# Patient Record
Sex: Female | Born: 1960 | Race: White | Hispanic: No | Marital: Married | Smoking: Never smoker
Health system: Southern US, Community
[De-identification: ages and names within clinical notes are randomized; demographics above are authoritative.]

## PROBLEM LIST (undated history)

## (undated) DIAGNOSIS — N189 Chronic kidney disease, unspecified: Secondary | ICD-10-CM

## (undated) DIAGNOSIS — E039 Hypothyroidism, unspecified: Secondary | ICD-10-CM

## (undated) DIAGNOSIS — C801 Malignant (primary) neoplasm, unspecified: Secondary | ICD-10-CM

## (undated) DIAGNOSIS — K589 Irritable bowel syndrome without diarrhea: Secondary | ICD-10-CM

## (undated) DIAGNOSIS — I341 Nonrheumatic mitral (valve) prolapse: Secondary | ICD-10-CM

## (undated) HISTORY — PX: TUBAL LIGATION: SHX77

## (undated) HISTORY — PX: SHOULDER SURGERY: SHX246

## (undated) HISTORY — PX: MANDIBLE SURGERY: SHX707

## (undated) HISTORY — PX: APPENDECTOMY: SHX54

## (undated) HISTORY — PX: ABDOMINAL HYSTERECTOMY: SHX81

## (undated) HISTORY — PX: BREAST SURGERY: SHX581

---

## 1997-07-04 ENCOUNTER — Other Ambulatory Visit: Admission: RE | Admit: 1997-07-04 | Discharge: 1997-07-04 | Payer: Self-pay | Admitting: Obstetrics & Gynecology

## 1998-10-22 ENCOUNTER — Other Ambulatory Visit: Admission: RE | Admit: 1998-10-22 | Discharge: 1998-10-22 | Payer: Self-pay | Admitting: Obstetrics & Gynecology

## 1999-11-04 ENCOUNTER — Other Ambulatory Visit: Admission: RE | Admit: 1999-11-04 | Discharge: 1999-11-04 | Payer: Self-pay | Admitting: Obstetrics & Gynecology

## 2000-11-26 ENCOUNTER — Other Ambulatory Visit: Admission: RE | Admit: 2000-11-26 | Discharge: 2000-11-26 | Payer: Self-pay | Admitting: Obstetrics & Gynecology

## 2002-01-13 ENCOUNTER — Other Ambulatory Visit: Admission: RE | Admit: 2002-01-13 | Discharge: 2002-01-13 | Payer: Self-pay | Admitting: Obstetrics & Gynecology

## 2003-02-02 ENCOUNTER — Other Ambulatory Visit: Admission: RE | Admit: 2003-02-02 | Discharge: 2003-02-02 | Payer: Self-pay | Admitting: Obstetrics & Gynecology

## 2003-04-13 ENCOUNTER — Ambulatory Visit (HOSPITAL_COMMUNITY): Admission: RE | Admit: 2003-04-13 | Discharge: 2003-04-13 | Payer: Self-pay | Admitting: Obstetrics & Gynecology

## 2003-04-13 ENCOUNTER — Encounter (INDEPENDENT_AMBULATORY_CARE_PROVIDER_SITE_OTHER): Payer: Self-pay | Admitting: *Deleted

## 2004-02-05 ENCOUNTER — Other Ambulatory Visit: Admission: RE | Admit: 2004-02-05 | Discharge: 2004-02-05 | Payer: Self-pay

## 2004-09-01 ENCOUNTER — Ambulatory Visit: Payer: Self-pay | Admitting: Unknown Physician Specialty

## 2005-03-02 ENCOUNTER — Other Ambulatory Visit: Admission: RE | Admit: 2005-03-02 | Discharge: 2005-03-02 | Payer: Self-pay | Admitting: Obstetrics & Gynecology

## 2005-05-19 ENCOUNTER — Ambulatory Visit: Payer: Self-pay | Admitting: Obstetrics & Gynecology

## 2005-08-06 ENCOUNTER — Ambulatory Visit (HOSPITAL_COMMUNITY): Admission: RE | Admit: 2005-08-06 | Discharge: 2005-08-06 | Payer: Self-pay | Admitting: Obstetrics & Gynecology

## 2006-04-15 ENCOUNTER — Encounter: Admission: RE | Admit: 2006-04-15 | Discharge: 2006-04-15 | Payer: Self-pay | Admitting: Obstetrics & Gynecology

## 2006-06-30 ENCOUNTER — Ambulatory Visit (HOSPITAL_COMMUNITY): Admission: RE | Admit: 2006-06-30 | Discharge: 2006-07-01 | Payer: Self-pay | Admitting: Obstetrics & Gynecology

## 2006-06-30 ENCOUNTER — Encounter (INDEPENDENT_AMBULATORY_CARE_PROVIDER_SITE_OTHER): Payer: Self-pay | Admitting: Specialist

## 2006-11-24 ENCOUNTER — Ambulatory Visit: Payer: Self-pay | Admitting: Family Medicine

## 2006-12-07 ENCOUNTER — Ambulatory Visit: Payer: Self-pay | Admitting: Family Medicine

## 2007-10-12 ENCOUNTER — Ambulatory Visit: Payer: Self-pay | Admitting: Unknown Physician Specialty

## 2007-10-21 ENCOUNTER — Ambulatory Visit: Payer: Self-pay | Admitting: Unknown Physician Specialty

## 2007-10-26 ENCOUNTER — Ambulatory Visit: Payer: Self-pay | Admitting: Unknown Physician Specialty

## 2007-10-26 HISTORY — PX: UPPER GI ENDOSCOPY: SHX6162

## 2007-10-26 LAB — HM COLONOSCOPY

## 2008-01-18 ENCOUNTER — Encounter: Admission: RE | Admit: 2008-01-18 | Discharge: 2008-01-18 | Payer: Self-pay | Admitting: Family Medicine

## 2008-02-13 ENCOUNTER — Ambulatory Visit: Payer: Self-pay | Admitting: Family Medicine

## 2008-11-26 ENCOUNTER — Ambulatory Visit: Payer: Self-pay | Admitting: Family Medicine

## 2009-02-28 ENCOUNTER — Ambulatory Visit: Payer: Self-pay | Admitting: Specialist

## 2009-04-09 ENCOUNTER — Ambulatory Visit: Payer: Self-pay | Admitting: Specialist

## 2009-04-16 ENCOUNTER — Ambulatory Visit: Payer: Self-pay | Admitting: Specialist

## 2010-01-13 ENCOUNTER — Ambulatory Visit: Payer: Self-pay | Admitting: Family Medicine

## 2010-03-07 ENCOUNTER — Ambulatory Visit: Payer: Self-pay | Admitting: Specialist

## 2010-03-16 ENCOUNTER — Encounter: Payer: Self-pay | Admitting: Obstetrics & Gynecology

## 2010-05-07 ENCOUNTER — Ambulatory Visit: Payer: Self-pay | Admitting: Unknown Physician Specialty

## 2010-06-30 LAB — HM DEXA SCAN: HM Dexa Scan: NORMAL

## 2010-07-11 NOTE — Op Note (Signed)
NAMEDURU, REIGER                ACCOUNT NO.:  [id number]   MEDICAL RECORD NO.:  [id number]          PATIENT TYPE:  AMB   LOCATION:  SDC                           FACILITY:  [facility]   PHYSICIAN:  Freddy Finner, M.D.   DATE OF BIRTH:  04-05-1960   DATE OF PROCEDURE:  08/06/2005  DATE OF DISCHARGE:                                 OPERATIVE REPORT   PREOPERATIVE DIAGNOSIS:  Persistent pelvic pain.   POSTOPERATIVE DIAGNOSES:  1.  Persistent pelvic pain.  2.  Boggy irregular enlargement of uterus consistent with adenomyosis.  3.  One filmy adhesion of sigmoid epiploica at the left cornual uterus.  4.  Small pseudo-window in the cul-de-sac possibly consistent with      endometriosis.  5.  No other intra-abdominal abnormalities noted.   OPERATIVE PROCEDURE:  1.  Diagnostic laparoscopy.  2.  Lysis of adhesions.  3.  Uterosacral nerve ablation with bipolar fulguration.  4.  Fulguration of pseudo0window in the cul-de-sac.   SURGEON:  Freddy Finner, M.D.   ANESTHESIA:  General.   INTRAOPERATIVE COMPLICATIONS:  None.   ESTIMATED INTRAOPERATIVE BLOOD LOSS:  Less than 5 mL.   HISTORY OF PRESENT ILLNESS:  The patient is a 50 year old who has had  chronic intermittent recurring pelvic pain.  In 2005, she had hysteroscopy,  D&C and NovaSure endometrial ablation which has produced a very good  clinical result, but in the last several months, she has continued to have  pelvic pain.  Her only persisting ultrasound and clinical findings are boggy  enlargement of the uterus.  She has had a CT of the abdomen and pelvis, all  of which was normal.  She is admitted now for laparoscopy.   DESCRIPTION OF PROCEDURE:  She was admitted on the morning of surgery,  brought to the operating room after receiving a 1-g bolus of Rocephin.  She  was placed under general anesthesia and placed in the dorsal lithotomy  position using the Allen stirrups system.  Betadine prep was carried out in  the usual  fashion of the abdomen and vagina.  Bladder was evacuated with  sterile technique using a Robinson catheter.  Sterile drapes were applied  after application of a Hulka tenaculum to the cervix.  Two small incisions  were made, 1 at the umbilicus and 1 just above the symphysis.  Through the  upper incision, a non-bladed disposable trocar was used.  Direct inspection  revealed adequate placement and no evidence of injury on entry.  Pneumoperitoneum was allowed to accumulate with carbon dioxide gas.  A 5-mm  trocar was placed in the lower incision under direct visualization and  __________  prior to being used for traction and exposure during the  procedure.  Systematic examination of the pelvic and abdominal contents was  carried out; findings are noted above and are recorded in photographs, which  were taken, in the office record.  Using the bipolar Kleppinger forceps, the  uterosacral ligaments were ablated at their junction with the cervix on each  side.  A small pseudo-window in the cul-de-sac was cul-de-sac was  fulgurated  with this also.  The adhesion to the left fundus was lysed just by traction  with a probe and traction on the uterus.  At completion of the procedure,  hemostasis was adequate.  All instruments were removed.  Gas was allowed to  escape from the abdomen.  Plain Marcaine 0.25% was injected into incisions  sites for postoperative analgesia.  The incisions were closed with  interrupted subcuticular sutures of 3-0 Dexon and Steri-Strips were applied  to the lower incision.  The patient was awakened and taken to the recovery  room in good condition.  She was given Toradol 30 mg IV and 30 mg IM  immediately on completion of the procedure.      Freddy Finner, M.D.  Electronically Signed     WRN/MEDQ  D:  08/06/2005  T:  08/06/2005  Job:  [id number]

## 2010-07-11 NOTE — H&P (Signed)
Crystal Russo, Crystal Russo                ACCOUNT NO.:  [id number]   MEDICAL RECORD NO.:  [id number]          PATIENT TYPE:  AMB   LOCATION:  SDC                           FACILITY:  [facility]   PHYSICIAN:  Freddy Finner, M.D.   DATE OF BIRTH:  May 13, 1960   DATE OF ADMISSION:  06/30/2006  DATE OF DISCHARGE:                              HISTORY & PHYSICAL   STAT PREADMISSION HISTORY AND PHYSICAL   ADMITTING DIAGNOSES:  1. Chronic pelvic pain.  2. Uterine enlargement most consistent with adenomyosis.   Patient is a 50 year old, white married female, gravida 4, para 3, who  has a long history of pelvic pain and menorrhagia.  Her primary  complaint was of severe dysmenorrhea and menorrhagia and in February of  2005 she had an endometrial ablation and from August of 2005 to the  present time she has had minimal if any cyclic uterine bleeding.  In  January of 2007, she first presented complaining of cyclic pelvic pain  even in the absence of menses, and she was evaluated at that time with  ultrasound of the pelvis, which did show slight enlargement of the  uterus, but no hematocolpos was identified; she did have a follicular  ovarian cyst.  She was started on NuvaRing at that time.  She has not  had adequate response from this.  In addition, she has had laparoscopic  diagnosis with lysis of a pelvic adhesion and uterosacral nerve ablation  in June of 2007.  Her symptoms have persisted, and CT of the abdomen and  pelvis has been performed with negative findings.  She has also had a  urological consult with Dr. Isabel Caprice with no abnormal urological findings.  Additional past gynecologic surgery includes tubal sterilization in 1995  and at that time the uterus was a little bit enlarged, but no other  identifiable pelvic pathology was noted.  Based on the persistence of  her symptoms and the probability that the primary origin is of the  uterine, she has requested definitive surgery.  She is now admitted  for  laparoscopically-assisted vaginal hysterectomy and bilateral salpingo-  oophorectomy.   CURRENT REVIEW OF SYSTEMS:  Otherwise negative for any cardiopulmonary,  GI, or GU complaints.   PAST MEDICAL HISTORY:  The patient has previously been treated for a  problem with ulcers/regurg and for this she takes AcipHex on a regular  basis.  There are no other significant medical illnesses except for  hypothyroidism for which she takes Levoxyl.  She does have premenstrual  symptoms for which she takes Zoloft.  She has been tried on transdermal  estrogen replacement system to guarantee tolerance and she has tolerated  this.  She does not use cigarettes or alcohol.  She has never had a  blood transfusion.  She has no known drug allergies.  She has had three  previous vaginal births.  She also had a D&E for a missed abortion in  1998.   PHYSICAL EXAMINATION:  HEENT:  Grossly within normal limits, the thyroid  gland is not palpably enlarged.  HEART:  Normal sinus rhythm without murmurs,  rubs, or gallops audible to  my examination.  CHEST:  Clear to auscultation.  BREASTS:  Exam is normal, no palpable masses, no nipple discharge or  skin change.  ABDOMEN:  Soft and nontender without appreciable organomegaly or  palpable masses.  PELVIC EXAM:  The external genitalia, vagina, and cervix are normal to  inspection.  Bimanual reveals the uterus to be slightly enlarged, there  are no palpable adnexal masses, the rectum is palpably normal, and  rectovaginal exam confirms the above findings.  EXTREMITIES:  Without cyanosis, clubbing, or edema.   ASSESSMENT:  Persistent pelvic pain, suspected adenomyosis and/or  leiomyomas.  Options of therapy have been discussed with the patient,  she has concluded that she wants laparoscopically-assisted vaginal  hysterectomy and wants both tubes and ovaries removed at the same time.  The latter is secondary to recurrent ovarian cysts when not on oral   contraception.  A video was reviewed in the office describing the  procedure including the potential risk of injury to other organs,  infection, and hemorrhage.  The possibility for an open procedure has  been discussed in the event of complications.  The patient is now  admitted and prepared to proceed with surgery.      Freddy Finner, M.D.  Electronically Signed     WRN/MEDQ  D:  06/29/2006  T:  06/29/2006  Job:  [id number]

## 2010-07-11 NOTE — Discharge Summary (Signed)
Crystal Russo, Crystal Russo                ACCOUNT NO.:  [id number]   MEDICAL RECORD NO.:  [id number]          PATIENT TYPE:  OIB   LOCATION:  9313                          [facility]ACILITY:  [facility]   PHYSICIAN:  Freddy Finner, M.D.   DATE OF BIRTH:  01/02/1961   DATE OF ADMISSION:  06/30/2006  DATE OF DISCHARGE:  07/01/2006                               DISCHARGE SUMMARY   DISCHARGE DIAGNOSES:  1. Uterine adenomyosis.  2. Pelvic adhesions.  3. Chronic pelvic pain.  4. Dysfunctional bleeding.  5. Dyspareunia.   INTRAOPERATIVE AND POSTOPERATIVE COMPLICATIONS:  None.   SURGICAL PROCEDURES:  Laparoscopically-assisted vaginal hysterectomy,  bilateral salpingo-oophorectomy and lysis of pelvic adhesions.   DISPOSITION AT DISCHARGE:  The patient was in satisfactory condition on  the day after surgery.  She was tolerating a regular diet.  She was  having adequate bowel and bladder function.  She was discharged home.  To avoid vaginal entry, to call for heavy vaginal bleeding or severe  pain, to return to the office in 2 weeks for postoperative follow-up.  She was given Percocet 5/325 to be taken as needed 1-2 every 4 hours for  pain and Zofran oral dissolving tablets 4 mg to be taken every 6 hours  as needed for pain.  She has started Vivelle-Dot 0.1 to be used for  postoperative hormonal management therapy.   PRESENT ILLNESS/PAST HISTORY/FAMILY HISTORY/REVIEW OF SYSTEMS/PHYSICAL  EXAMINATION:  Details recorded in the admission note.  Briefly, the  patient continued to have chronic pain.  She had clinical and ultrasound  findings consistent with uterine adenomyosis.  Various conservative  management protocols were followed without success in managing her  symptoms.  She was admitted now for surgery.   LABORATORY DATA DURING THIS ADMISSION:  Preoperative CBC with hemoglobin  of 12.1.  Prothrombin time and PTT were normal.  Postoperative  hemoglobin was 11.1.   HOSPITAL COURSE:  The patient was  admitted on the morning of surgery.  She was treated perioperatively with PAS hose and with IV antibiotic.  The above-described surgical procedure was accomplished without  intraoperative difficulty.  Postoperatively, her course was  uncomplicated.  She remained afebrile.  Her hemoglobin was adequate.  She was ambulating without difficulty and having adequate bowel and  bladder function.  She was discharged home with disposition as noted  above.     Freddy Finner, M.D.  Electronically Signed    WRN/MEDQ  D:  08/09/2006  T:  08/09/2006  Job:  [id number]

## 2010-07-11 NOTE — Op Note (Signed)
Crystal Russo, Crystal Russo                ACCOUNT NO.:  [id number]   MEDICAL RECORD NO.:  [id number]          PATIENT TYPE:  OIB   LOCATION:  9313                          [facility]ACILITY:  [facility]   PHYSICIAN:  Freddy Finner, M.D.   DATE OF BIRTH:  [birth year]   DATE OF PROCEDURE:  06/30/2006  DATE OF DISCHARGE:                               OPERATIVE REPORT   PREOPERATIVE DIAGNOSIS:  Uterine enlargement, probable adenomyosis,  chronic pelvic pain.   POSTOPERATIVE DIAGNOSES:  1. Uterine enlargement, probable adenomyosis, chronic pelvic pain.  2. Adhesions of sigmoid epiploica to left uterine cornu and left      infundibulopelvic ligament.   OPERATIVE PROCEDURE:  Laparoscopic-assisted vaginal hysterectomy,  bilateral salpingo-oophorectomy, lysis of adhesions.   SURGEON:  Efraim Kaufmann, M.D.   ASSISTANT:  Zelphia Cairo, M.D.   ESTIMATED INTRAOPERATIVE BLOOD LOSS:  100 cc.   INTRAOPERATIVE COMPLICATIONS:  None.   PROCEDURE:  Details of the present illness are recorded in the admission  note.  The patient was admitted on the morning of surgery.  She was  brought to the operating room after receiving a 1 g bolus of Ancef IV.  She was placed under adequate general endotracheal anesthesia and placed  in the dorsal lithotomy position using the [employer] system.  Betadine prep using scrub followed by solution was carried, including  prep of the abdomen, perineum, and vagina.  Bladder was evacuated with a  sterile catheter.  Hulka tenaculum was attached to the cervix without  difficulty.  Sterile drapes were applied.  Two small incisions were made  through the old scars, one at the umbilicus, one just above the  symphysis.  Through the upper incision, an 11 mm bladed disposable  trocar was introduced while elevating the anterior abdominal wall  manually.  Direct inspection revealed no evidence of injury on entry and  adequate placement within the peritoneal cavity.  Pneumoperitoneum was  allowed to accumulate with carbon dioxide gas.  A 5 mm trocar was placed  through the lower incision just above the symphysis.  Through it,  various instruments were used, including a spring-loaded grasping  forceps and __________  irrigation system.  Scanning inspection of the  upper abdomen revealed no apparent abnormalities.  The appendix was not  visualized.  Pelvic contents were visualized and photographs made.  There were adhesions of the sigmoid colon to the cornual uterus and left  infundibulopelvic ligament.  No peritoneal lesions were noted.  Using  the tripolar device through the operating channel of the laparoscope,  the adhesions on the left were lysed.  The grasping forceps were used to  elevate the adnexa on the right side, and with progressive pedicles  using the tripolar device, the infundibulopelvic ligaments, round  ligament, and upper broad ligament was developed, sealed, and divided.  Left side was treated essentially identically.  Attention was then  turned vaginally.  Posterior weighted vaginal retractor was placed.  Hulka tenaculum was replaced with a Jacobs tenaculum.  Colpotomy  incision was made while tenting the mucosa posterior to the cervix.  The  cervix was circumscribed  with a scalpel.  Using the LigaSure device, the  uterosacral pedicles were sealed and divided.  The bladder pillars were  similarly taken, sealed, and divided.  The bladder was further advanced  off the cervix.  Cardinal ligament pedicles were taken, sealed, and  divided with the LigaSure.  Anterior peritoneum was entered.  Vessel  pedicles were taken, sealed, and divided with LigaSure, and second  pedicle above the vessels on the left side.  This allowed delivery of  the uterus through the introitus, with tubes and ovaries attached.  Angles of the vagina were then anchored to the uterosacrals with  mattress sutures of 0 Monocryl.  The uterosacrals were plicated and  posterior peritoneum  closed with interrupted 0 Monocryl.  Cuff was  closed vertically with figure-of-eights of 0 Monocryl.  Foley catheter  was placed.  Reinspection laparoscopically was carried out using the  [employer] irrigating system.  Hemostasis was complete.  Irrigating solution  was aspirated from the abdomen.  Gas was allowed to escape from the  abdomen.  Incisions were closed with interrupted subcuticular sutures of  3-0 Dexon.  0.25% plain Marcaine was injected into the incision sites  for postoperative analgesia.  Steri-Strips were applied to the lower  incision, and a sterile dressing to the umbilical incision.  The patient  was awakened and taken to recovery in good condition.      Freddy Finner, M.D.  Electronically Signed     WRN/MEDQ  D:  06/30/2006  T:  06/30/2006  Job:  [id number]

## 2010-07-11 NOTE — Op Note (Signed)
NAMECHEYENNE, Crystal Russo                          ACCOUNT NO.:  [id number]   MEDICAL RECORD NO.:  [id number]                   PATIENT TYPE:  AMB   LOCATION:  SDC                                  [facility]   PHYSICIAN:  Freddy Finner, M.D.                DATE OF BIRTH:  10/04/1960   DATE OF PROCEDURE:  04/13/2003  DATE OF DISCHARGE:                                 OPERATIVE REPORT   PREOPERATIVE DIAGNOSES:  1. Uterine enlargement, suspected adenomyosis.  2. Normal uterine cavity.  3. Clinical symptoms of severe menorrhagia unresponsive to oral     contraceptives.   POSTOPERATIVE DIAGNOSES:  1. Uterine enlargement, suspected adenomyosis.  2. Normal uterine cavity.  3. Clinical symptoms of severe menorrhagia unresponsive to oral     contraceptives.   OPERATIVE PROCEDURE:  1. Hysteroscopy.  2. D&C.  3. NovaSure endometrial ablation.   INTRAOPERATIVE COMPLICATIONS:  None.   ESTIMATED INTRAOPERATIVE BLOOD LOSS:  10 mL.   SORBITOL DEFICIT INTRAOPERATIVELY:  50 mL.   The patient is a 50 year old, who had sonohysterogram showing a normal  uterine cavity with thickening of the uterine wall, most consistent with  adenomyosis.  She has been unresponsive to hormonal manipulation to control  her very heavy menorrhagia, and thus first line of treatment has elected to  proceed with endometrial ablation.  She was admitted on the morning of  surgery.  She was given 2 g bolus of Cefotetan IV.  She was brought to the  operating room and there placed under adequate intravenous sedation, placed  in the dorsal lithotomy position using Allen stirrup system.  Betadine prep  was carried out in the usual fashion.  Bivalve speculum was introduced and  the cervix grasped with a single-tooth tenaculum on the anterior lip.  Paracervical block was placed using 10 mL of 1% plain Xylocaine.  The cervix  was sounded to 3.5 cm.  The uterus and cervix sounded to 9 cm.  This gave a  cavity length of 5.5  cm, cavity width which was later checked with a device  was 2.8.  The power setting required for this procedure was 85.  Time of  procedure was 105 seconds.  This was the NovaSure piece.  Prior to actually  performing NovaSure, the cervix was dilated to 23 with [facility] dilators.  The  ACMI 12.5-degree hysteroscope was introduced, and inspection revealed a  normal uterine cavity.  Gentle thorough curettage was carried out and a  sample submitted for histologic examination.  The NovaSure procedure was  then begun, and standard steps were followed with the representative in  attendance for consultation during his initial procedure.  The procedure was  accomplished without difficulty.  The NovaSure device removed from the  uterus.  Reinspection confirmed complete ablation of the endometrium.  Photographs were made of the endometrial cavity before and after NovaSure  therapy.  Please note  that  all safety measures were followed, including the carbon dioxide testing  for uterine perforation.  The patient was then awakened and taken to the  recovery room in good condition.  She was given Vicodin to be taken as  needed for postoperative pain.  She was given Toradol 30 mg IV and 30 mg IM  intraoperatively.                                               Freddy Finner, M.D.    WRN/MEDQ  D:  04/13/2003  T:  04/13/2003  Job:  [id number]

## 2010-12-01 LAB — TSH: TSH: 0.04 u[IU]/mL — AB (ref <=5.90)

## 2010-12-04 ENCOUNTER — Ambulatory Visit: Payer: Self-pay | Admitting: Endocrinology

## 2010-12-04 IMAGING — US US THYROID
1 series · 17 of 25 positions shown · non-contrast
Comparison: none

REASON FOR EXAM: multinodular goiter
COMMENTS:

[Series 1: us thyroid · 17 of 47 slices shown]
[im 1/47]
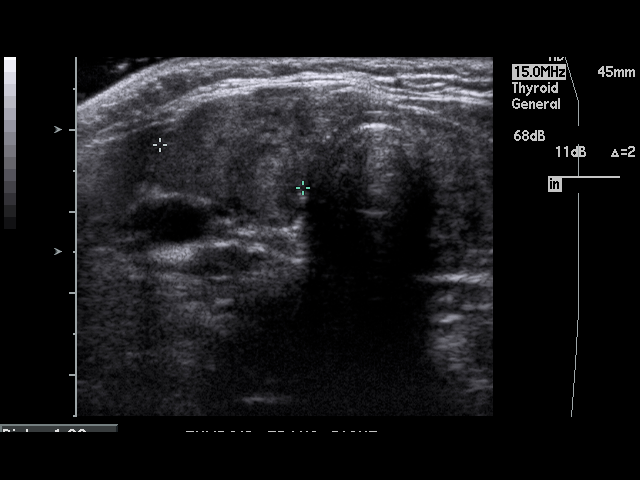
[im 4/47]
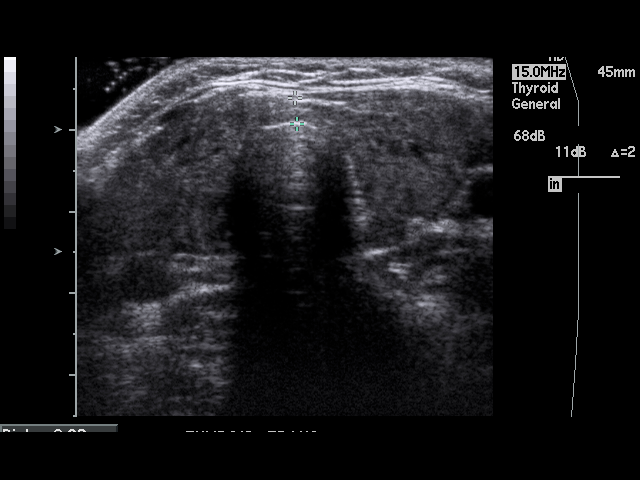
[im 6/47]
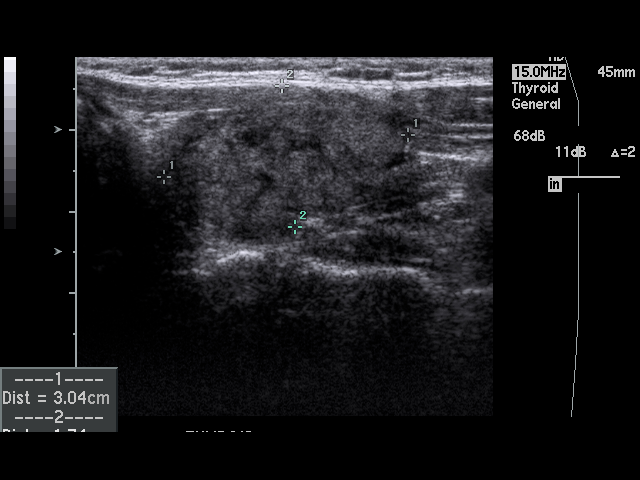
[im 10/47]
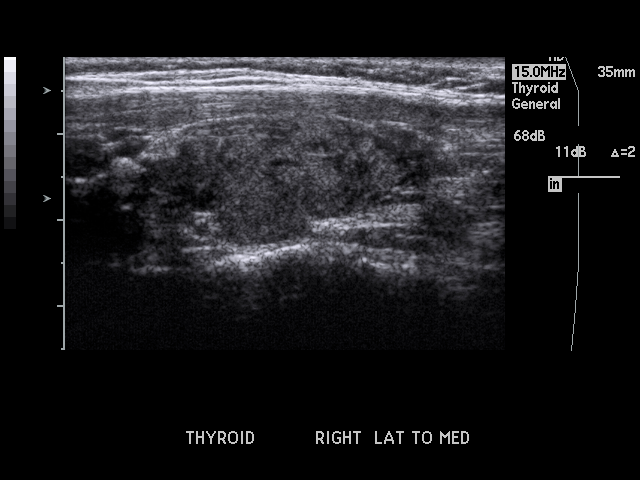
[im 12/47]
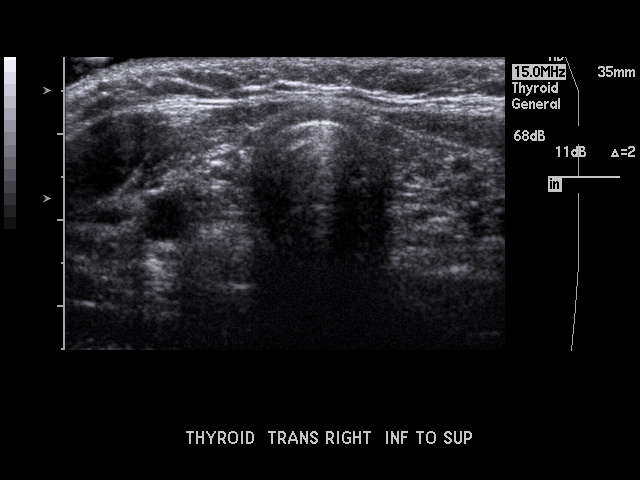
[im 16/47]
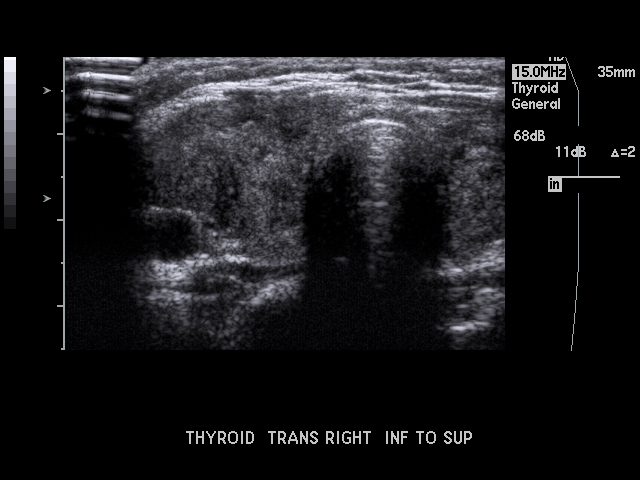
[im 18/47]
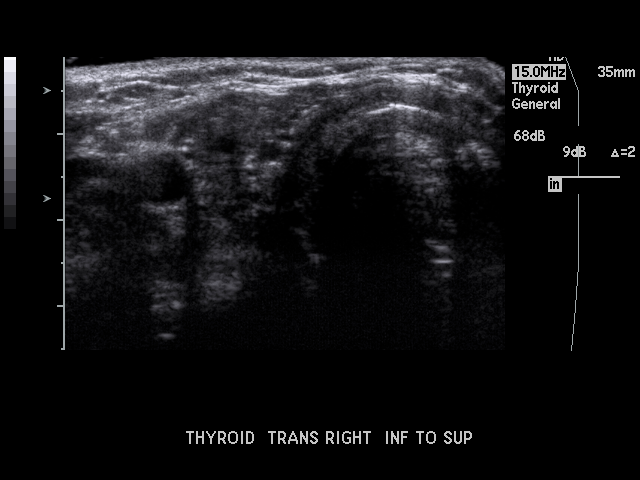
[im 22/47]
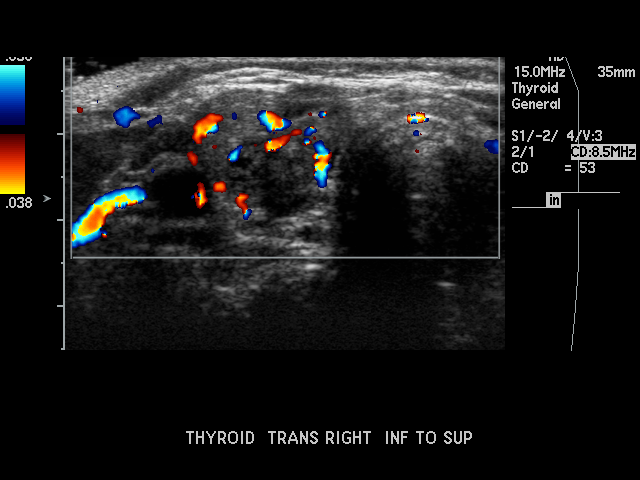
[im 24/47]
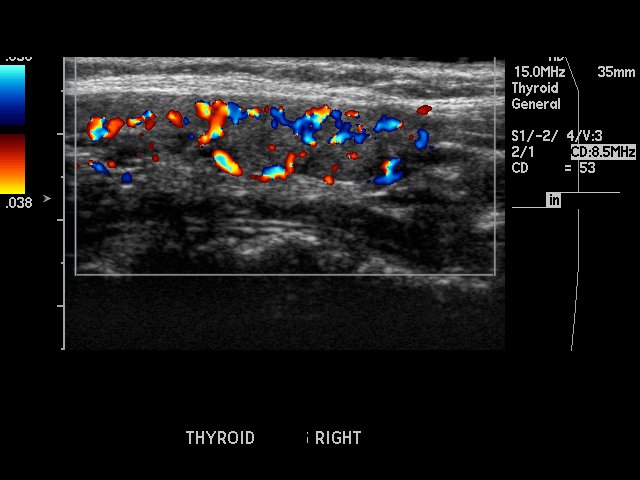
[im 25/47]
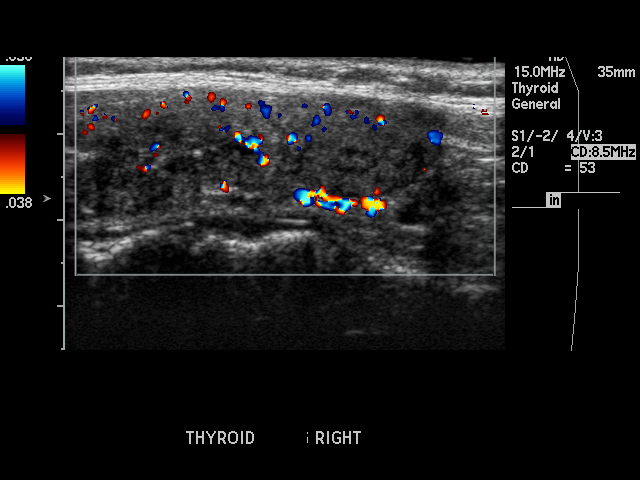
[im 29/47]
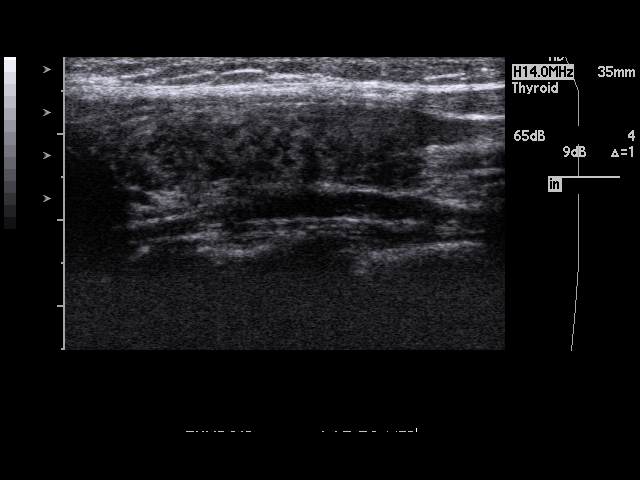
[im 31/47]
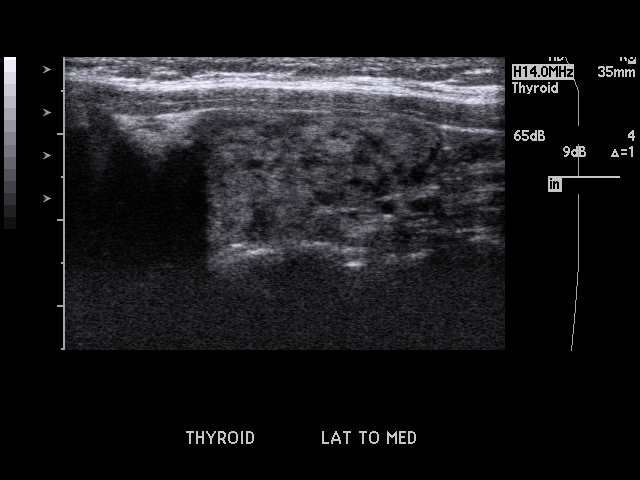
[im 35/47]
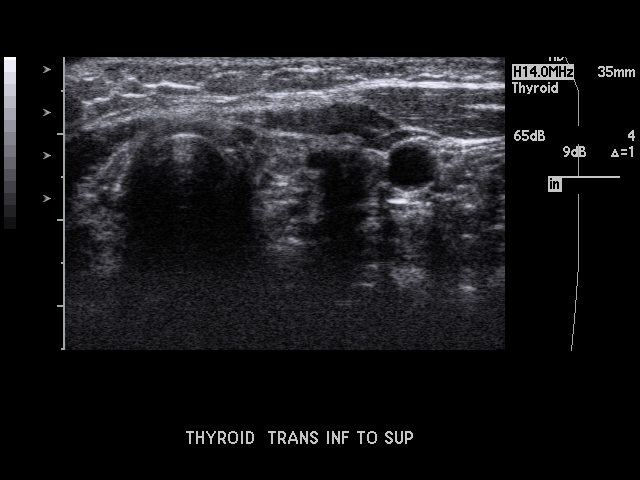
[im 37/47]
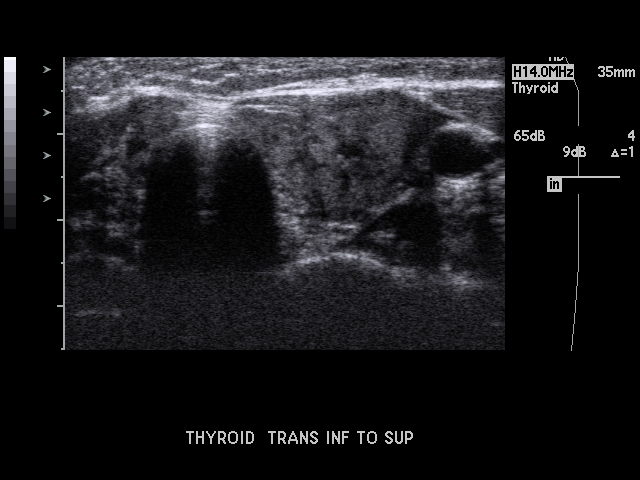
[im 41/47]
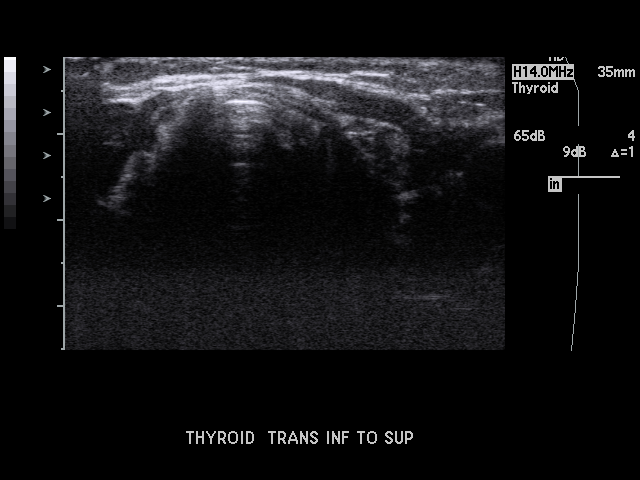
[im 43/47]
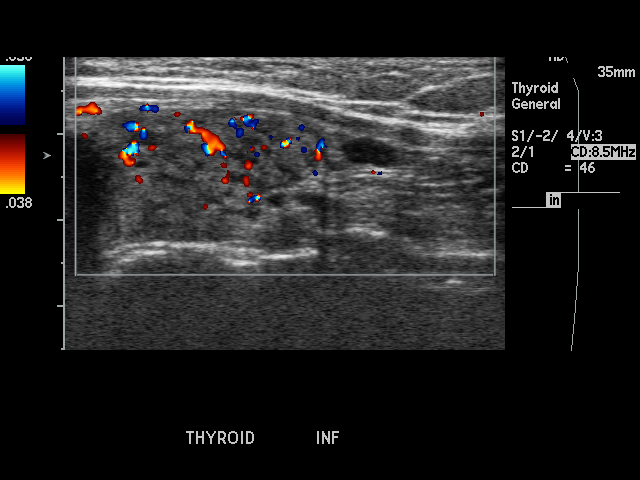
[im 47/47]
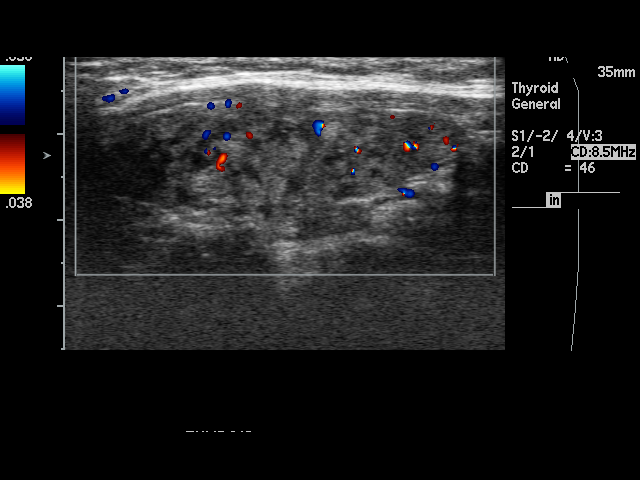

[17 of 25 positions shown; findings below may reference images not displayed]

PROCEDURE:     HADUNELI - HADUNELI THYROID  - [DATE] [DATE]

RESULT:     Thyroid gland sonogram shows the right lobe measures 3.15 x
x 1.83 cm. The left lobe measures 3.04 x 1.74 x 1.52 cm. The isthmus shows
an anterior to posterior dimension of 0.32 cm. There is a heterogeneous
appearance of both lobes with mixed echotexture. No definite calcifications
are evident. No well defined masses are present.
IMPRESSION: Very heterogeneous appearance of the gland diffusely. The
findings can be consistent with multinodular goiter.

## 2011-12-01 ENCOUNTER — Other Ambulatory Visit: Payer: Self-pay | Admitting: Endocrinology

## 2011-12-01 DIAGNOSIS — R1310 Dysphagia, unspecified: Secondary | ICD-10-CM

## 2011-12-03 ENCOUNTER — Ambulatory Visit
Admission: RE | Admit: 2011-12-03 | Discharge: 2011-12-03 | Disposition: A | Discharge status: Home / Self Care | Payer: BC Managed Care – PPO | Source: Ambulatory Visit | Attending: Endocrinology | Admitting: Endocrinology

## 2011-12-03 DIAGNOSIS — R1310 Dysphagia, unspecified: Secondary | ICD-10-CM

## 2012-05-30 ENCOUNTER — Ambulatory Visit: Payer: Self-pay | Admitting: Family Medicine

## 2012-10-13 DIAGNOSIS — Z86018 Personal history of other benign neoplasm: Secondary | ICD-10-CM

## 2012-10-13 HISTORY — DX: Personal history of other benign neoplasm: Z86.018

## 2013-09-25 ENCOUNTER — Other Ambulatory Visit: Payer: Self-pay | Admitting: Obstetrics & Gynecology

## 2013-09-26 LAB — CYTOLOGY - PAP

## 2014-03-21 LAB — CBC AND DIFFERENTIAL
HCT: 37 % (ref 36–46)
HEMOGLOBIN: 12.2 g/dL (ref 12.0–16.0)
Neutrophils Absolute: 2 /uL
Platelets: 393 10*3/uL (ref 150–399)
WBC: 4.7 10*3/mL

## 2014-03-21 LAB — LIPID PANEL
Cholesterol: 215 mg/dL — AB (ref 0–200)
HDL: 72 mg/dL — AB (ref 35–70)
LDL CALC: 124 mg/dL
LDL/HDL RATIO: 1.7
TRIGLYCERIDES: 96 mg/dL (ref 40–160)

## 2014-03-21 LAB — BASIC METABOLIC PANEL
BUN: 17 mg/dL (ref 4–21)
CREATININE: 0.9 mg/dL (ref 0.5–1.1)
GLUCOSE: 94 mg/dL
POTASSIUM: 4.4 mmol/L (ref 3.4–5.3)
Sodium: 141 mmol/L (ref 137–147)

## 2014-03-21 LAB — HEPATIC FUNCTION PANEL
ALK PHOS: 86 U/L (ref 25–125)
ALT: 11 U/L (ref 7–35)
AST: 13 U/L (ref 13–35)

## 2014-06-25 ENCOUNTER — Ambulatory Visit
Admission: RE | Admit: 2014-06-25 | Discharge: 2014-06-25 | Disposition: A | Discharge status: Home / Self Care | Payer: BLUE CROSS/BLUE SHIELD | Attending: Family Medicine | Admitting: Family Medicine

## 2014-06-25 ENCOUNTER — Other Ambulatory Visit: Payer: Self-pay | Admitting: *Deleted

## 2014-06-25 ENCOUNTER — Ambulatory Visit
Admission: RE | Admit: 2014-06-25 | Discharge: 2014-06-25 | Disposition: A | Discharge status: Home / Self Care | Payer: BLUE CROSS/BLUE SHIELD | Source: Ambulatory Visit | Attending: *Deleted | Admitting: *Deleted

## 2014-06-25 DIAGNOSIS — R110 Nausea: Secondary | ICD-10-CM

## 2014-06-25 DIAGNOSIS — M549 Dorsalgia, unspecified: Secondary | ICD-10-CM

## 2014-07-31 ENCOUNTER — Other Ambulatory Visit: Payer: Self-pay

## 2014-07-31 DIAGNOSIS — N200 Calculus of kidney: Secondary | ICD-10-CM

## 2014-08-07 ENCOUNTER — Ambulatory Visit
Admission: RE | Admit: 2014-08-07 | Discharge: 2014-08-07 | Disposition: A | Discharge status: Home / Self Care | Payer: BLUE CROSS/BLUE SHIELD | Source: Ambulatory Visit | Attending: Urology | Admitting: Urology

## 2014-08-07 DIAGNOSIS — R109 Unspecified abdominal pain: Secondary | ICD-10-CM | POA: Insufficient documentation

## 2014-08-07 DIAGNOSIS — N200 Calculus of kidney: Secondary | ICD-10-CM

## 2014-08-07 HISTORY — DX: Malignant (primary) neoplasm, unspecified: C80.1

## 2014-08-07 MED ORDER — IOHEXOL 300 MG/ML  SOLN
100.0000 mL | Freq: Once | INTRAMUSCULAR | Status: AC | PRN
  Administered 2014-08-07: 100 mL via INTRAVENOUS

## 2014-08-15 ENCOUNTER — Telehealth: Payer: Self-pay

## 2014-08-15 NOTE — Telephone Encounter (Signed)
-----   Message from Hollice Espy, MD sent at 08/15/2014  8:57 AM EDT ----- Dr. Elnoria Howard ordered this scan for flank pain.  There are NO stones or any explanation for pain.  Likely MSK.  Please make patient aware.   Hollice Espy, MD

## 2014-08-15 NOTE — Telephone Encounter (Signed)
Spoke with pt and made aware of results of scan. Pt voiced understanding. Cw,lpn

## 2014-08-30 ENCOUNTER — Ambulatory Visit: Payer: Self-pay | Admitting: Urology

## 2014-10-17 ENCOUNTER — Other Ambulatory Visit: Payer: Self-pay | Admitting: Obstetrics & Gynecology

## 2014-10-19 ENCOUNTER — Other Ambulatory Visit: Payer: Self-pay | Admitting: Family Medicine

## 2014-10-19 LAB — CYTOLOGY - PAP

## 2014-11-08 ENCOUNTER — Other Ambulatory Visit: Payer: Self-pay | Admitting: Family Medicine

## 2014-11-08 DIAGNOSIS — S060XAA Concussion with loss of consciousness status unknown, initial encounter: Secondary | ICD-10-CM | POA: Insufficient documentation

## 2014-11-08 DIAGNOSIS — F411 Generalized anxiety disorder: Secondary | ICD-10-CM | POA: Insufficient documentation

## 2014-11-08 DIAGNOSIS — S060X9A Concussion with loss of consciousness of unspecified duration, initial encounter: Secondary | ICD-10-CM | POA: Insufficient documentation

## 2014-11-08 DIAGNOSIS — K219 Gastro-esophageal reflux disease without esophagitis: Secondary | ICD-10-CM | POA: Insufficient documentation

## 2014-11-08 DIAGNOSIS — J309 Allergic rhinitis, unspecified: Secondary | ICD-10-CM | POA: Insufficient documentation

## 2014-11-08 NOTE — Telephone Encounter (Signed)
Last OV 06/2014  Thanks,   -Aadya Kindler  

## 2014-11-10 ENCOUNTER — Other Ambulatory Visit: Payer: Self-pay | Admitting: Family Medicine

## 2014-11-10 DIAGNOSIS — S060XAA Concussion with loss of consciousness status unknown, initial encounter: Secondary | ICD-10-CM

## 2014-11-10 DIAGNOSIS — S060X9A Concussion with loss of consciousness of unspecified duration, initial encounter: Secondary | ICD-10-CM

## 2014-11-12 NOTE — Telephone Encounter (Signed)
Last ov was on 07/02/2014. Last time this medication was refilled was on 10/25/2013 x 12.  Thanks,

## 2015-01-23 ENCOUNTER — Telehealth: Payer: Self-pay | Admitting: Family Medicine

## 2015-01-23 DIAGNOSIS — F419 Anxiety disorder, unspecified: Secondary | ICD-10-CM

## 2015-01-23 MED ORDER — ALPRAZOLAM 0.25 MG PO TABS: ORAL_TABLET | ORAL | Status: DC

## 2015-01-23 NOTE — Telephone Encounter (Signed)
RX called in; Pt advised.  Thanks,   -Mickel Baas

## 2015-01-23 NOTE — Telephone Encounter (Signed)
Alprazolam 0.25 mg q 8hrs prn,#55,1rf.

## 2015-01-23 NOTE — Telephone Encounter (Signed)
Devon's fatherinlaw passed early Monday morning. Found out 3 weeks ago he had CA and was told he has 6 mths to year to live but died suddenly.   She was holding him when he passed.She is having crying spells and would like to have a MILD medication to help her get through this.    She said meds easily make her sleepy, so something mild.    [facility].    Call pt when this is done please.  [phone]

## 2015-02-04 ENCOUNTER — Telehealth: Payer: Self-pay | Admitting: Family Medicine

## 2015-02-04 DIAGNOSIS — K219 Gastro-esophageal reflux disease without esophagitis: Secondary | ICD-10-CM

## 2015-02-04 NOTE — Telephone Encounter (Signed)
Pt call saying her new insurance will not pay for the aciphex .  She has been taking this for 10 years but the new will not pay or ever let her get a PA.  She needs to know what she should do.  Her call back is  [phone]  Thanks Con Memos

## 2015-02-05 NOTE — Telephone Encounter (Signed)
What should she take? Per alscripts i do not see any other medications tried. Please review-aa

## 2015-02-06 NOTE — Telephone Encounter (Signed)
Patient states she will try any of the following medication which ever Dr. Rosanna Randy thinks is the best medication. Patient is requesting to take the medication twice daily like she was taking the Aciphex.   Pharmacy- [facility]

## 2015-02-06 NOTE — Telephone Encounter (Signed)
Which he like to try pantoprazole or lansoprazole or as omeprazole or omeprazole?

## 2015-02-07 MED ORDER — PANTOPRAZOLE SODIUM 40 MG PO TBEC: 40.0000 mg | DELAYED_RELEASE_TABLET | Freq: Every day | ORAL | Status: DC

## 2015-02-07 NOTE — Telephone Encounter (Signed)
Advised patient as below. Med sent into pharmacy.

## 2015-02-07 NOTE — Telephone Encounter (Signed)
Pantooprazole 40mg  BID--60,12rf. Try this.

## 2015-05-03 ENCOUNTER — Other Ambulatory Visit: Payer: Self-pay | Admitting: Family Medicine

## 2015-05-03 ENCOUNTER — Telehealth: Payer: Self-pay | Admitting: Family Medicine

## 2015-05-03 DIAGNOSIS — K219 Gastro-esophageal reflux disease without esophagitis: Secondary | ICD-10-CM

## 2015-05-03 MED ORDER — PANTOPRAZOLE SODIUM 40 MG PO TBEC: 40.0000 mg | DELAYED_RELEASE_TABLET | Freq: Two times a day (BID) | ORAL | Status: DC

## 2015-05-03 NOTE — Telephone Encounter (Signed)
Pt contacted office for refill request on the following medications: pantoprazole (PROTONIX) 40 MG tablet to [facility]   [name] stated that she was advised to take this twice a day and the RX was written for a QTY of 60 tablets but the directions were take once a day. Pt stated she has been taking them twice a day since she was started on the medication and she was never told she was getting them filled to soon. Pt stated today she is out and was advised that it wasn't due to be filled until April. Pt would a new RX sent in today if possible with the directions corrected. Please advise. Thanks TNP

## 2015-05-03 NOTE — Telephone Encounter (Signed)
Done-aa 

## 2015-05-08 ENCOUNTER — Other Ambulatory Visit: Payer: Self-pay | Admitting: Family Medicine

## 2015-05-16 ENCOUNTER — Other Ambulatory Visit: Payer: Self-pay | Admitting: Family Medicine

## 2015-05-17 ENCOUNTER — Telehealth: Payer: Self-pay

## 2015-05-17 DIAGNOSIS — E785 Hyperlipidemia, unspecified: Secondary | ICD-10-CM | POA: Insufficient documentation

## 2015-05-17 DIAGNOSIS — K589 Irritable bowel syndrome without diarrhea: Secondary | ICD-10-CM | POA: Insufficient documentation

## 2015-05-17 DIAGNOSIS — F320 Major depressive disorder, single episode, mild: Secondary | ICD-10-CM | POA: Insufficient documentation

## 2015-05-17 DIAGNOSIS — E042 Nontoxic multinodular goiter: Secondary | ICD-10-CM | POA: Insufficient documentation

## 2015-05-17 DIAGNOSIS — K449 Diaphragmatic hernia without obstruction or gangrene: Secondary | ICD-10-CM | POA: Insufficient documentation

## 2015-05-17 DIAGNOSIS — K649 Unspecified hemorrhoids: Secondary | ICD-10-CM | POA: Insufficient documentation

## 2015-05-17 DIAGNOSIS — Z8669 Personal history of other diseases of the nervous system and sense organs: Secondary | ICD-10-CM | POA: Insufficient documentation

## 2015-05-17 DIAGNOSIS — E039 Hypothyroidism, unspecified: Secondary | ICD-10-CM | POA: Insufficient documentation

## 2015-05-17 DIAGNOSIS — I059 Rheumatic mitral valve disease, unspecified: Secondary | ICD-10-CM | POA: Insufficient documentation

## 2015-05-17 MED ORDER — RABEPRAZOLE SODIUM 20 MG PO TBEC: 20.0000 mg | DELAYED_RELEASE_TABLET | Freq: Two times a day (BID) | ORAL | Status: DC

## 2015-05-17 NOTE — Telephone Encounter (Signed)
Patient called to discuss her reflux. Patient states that she has been taking Pantoprazole since December her insurance is not approving BID directions and she states her symptoms were not a good controlled as when she was on Aciphex. She has been taking Prilosec only in the past 3 weeks BID and her symptoms are out of control-severe diarrhea and stomach burning, cant eat well. She has tried in the past Zantac, Prevacid, Prilosec/Omeprazole, Pantoprazole. Spoke with her insurance representative and got Aciphex for 20 mg 1 po BID dates of approval are 05/17/15-05/16/16 (case ID [id number]), also got quantity exception approved also 05/17/15-05/16/16 (case ID [id number]). [id number]

## 2015-05-17 NOTE — Telephone Encounter (Signed)
Pt advised of below and RX sent in-aa

## 2015-06-20 ENCOUNTER — Other Ambulatory Visit: Payer: Self-pay | Admitting: Family Medicine

## 2015-07-30 ENCOUNTER — Encounter: Payer: Self-pay | Admitting: Family Medicine

## 2015-07-30 ENCOUNTER — Ambulatory Visit (INDEPENDENT_AMBULATORY_CARE_PROVIDER_SITE_OTHER): Payer: BLUE CROSS/BLUE SHIELD | Admitting: Family Medicine

## 2015-07-30 VITALS — BP 96/57 | HR 84 | Temp 98.3°F | Resp 14 | Wt 162.0 lb

## 2015-07-30 DIAGNOSIS — J0101 Acute recurrent maxillary sinusitis: Secondary | ICD-10-CM

## 2015-07-30 DIAGNOSIS — F419 Anxiety disorder, unspecified: Secondary | ICD-10-CM

## 2015-07-30 DIAGNOSIS — K219 Gastro-esophageal reflux disease without esophagitis: Secondary | ICD-10-CM | POA: Diagnosis not present

## 2015-07-30 MED ORDER — AMOXICILLIN 500 MG PO CAPS: ORAL_CAPSULE | ORAL | Status: DC

## 2015-07-30 MED ORDER — FLUCONAZOLE 150 MG PO TABS: 150.0000 mg | ORAL_TABLET | Freq: Once | ORAL | Status: DC

## 2015-07-30 NOTE — Progress Notes (Signed)
Patient ID: Crystal Russo, female   DOB: 03-21-1960, 55 y.o.   MRN: [id number]    Subjective:  HPI  Patient has not felt good for 4 days now. Started with a cough on Friday June 2nd-cough is mainly dry with some productivity when she takes a shower. Other symptoms are nasal congestion, head congestion, sinus pressure below eyes, ear pressure, chest congestion. No fever, no chills no flu like body aches. She has been using Flonase.  Prior to Admission medications   Medication Sig Start Date End Date Taking? Authorizing Provider  ALPRAZolam (XANAX) 0.25 MG tablet 1 Every 8 hours as needed. 01/23/15  Yes Kristopher Delk Maceo Pro., MD  Cholecalciferol (VITAMIN D) 2000 units CAPS Take by mouth. 01/05/11  Yes Historical Provider, MD  clidinium-chlordiazePOXIDE (LIBRAX) 5-2.5 MG per capsule TAKE 1 CAPSULE BY MOUTH TWICE A DAY AS NEEDED Patient taking differently: TAKE 1 CAPSULE BY MOUTH TWICE A DAY 11/11/14  Yes Darbi Chandran Maceo Pro., MD  DULoxetine (CYMBALTA) 60 MG capsule TAKE ONE CAPSULE BY MOUTH EVERY DAY 06/21/15  Yes Khiree Bukhari Maceo Pro., MD  fluticasone [facility]) 50 MCG/ACT nasal spray USE 2 SPRAYS IN THE NOSE DAILY AS DIRECTED 11/09/14  Yes Desa Rech Maceo Pro., MD  folic acid (FOLVITE) 1 MG tablet TAKE 1 TABLET BY MOUTH EVERY DAY 05/16/15  Yes Jerrol Banana., MD  ibuprofen (ADVIL,MOTRIN) 800 MG tablet TAKE 1 TABLET TWICE A DAY AS NEEDED 11/12/14  Yes Jerrol Banana., MD  MINIVELLE 0.1 MG/24HR patch USE TWICE WEEKLY 07/12/15  Yes Historical Provider, MD  RABEprazole (ACIPHEX) 20 MG tablet Take 1 tablet (20 mg total) by mouth 2 (two) times daily. 05/17/15  Yes Jacoba Cherney Maceo Pro., MD  SYNTHROID 150 MCG tablet Take 150 mcg by mouth daily. 07/20/15  Yes Historical Provider, MD  TOBRADEX ophthalmic ointment APPLY 0.5 INCH(ES) IN RIGHT EYE 4 TIMES A DAY FOR 14 DAYS 07/10/15  Yes Historical Provider, MD  topiramate (TOPAMAX) 50 MG tablet TAKE 1 TABLET BY MOUTH DAILY 05/08/15  Yes Keyon Liller Maceo Pro., MD    Patient Active Problem List   Diagnosis Date Noted  . Hemorrhoid 05/17/2015  . 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 History of migraine headaches 05/17/2015  . HLD (hyperlipidemia) 05/17/2015  . Adult hypothyroidism 05/17/2015  . 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 Mild major depression ([place]) 05/17/2015  . Multinodular goiter 05/17/2015  . 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 Allergic rhinitis 11/08/2014  . Concussion injury of body structure 11/08/2014  . Anxiety, generalized 11/08/2014  . Acid reflux 11/08/2014    Past Medical History  Diagnosis Date  . Cancer [facility])     skin cancer    Social History   Social History  . Marital Status: Married    Spouse Name: N/A  . Number of Children: N/A  . Years of Education: N/A   Occupational History  . Not on file.   Social History Main Topics  . Smoking status: Never Smoker   . Smokeless tobacco: Never Used  . Alcohol Use: Yes     Comment: once a month  . Drug Use: No  . Sexual Activity: Yes    Birth Control/ Protection: None   Other Topics Concern  . Not on file   Social History Narrative    No Known Allergies  Review of Systems  Constitutional: Positive for malaise/fatigue. Negative for fever and chills.  HENT: Positive for congestion.   Respiratory: Positive for cough.   Cardiovascular:  Negative.   Musculoskeletal: Positive for myalgias.  Neurological: Negative for headaches.    Immunization History  Administered Date(s) Administered  . Tdap 05/05/2006   Objective:  BP 96/57 mmHg  Pulse 84  Temp(Src) 98.3 F (36.8 C)  Resp 14  Wt 162 lb (73.483 kg)  SpO2 97%  Physical Exam  Constitutional: She is oriented to person, place, and time and well-developed, well-nourished, and in no distress.  HENT:  Head: Normocephalic and atraumatic.  Right Ear: External ear normal.  Left Ear: External ear normal.  Nose: Right sinus exhibits maxillary sinus tenderness. Left sinus exhibits maxillary  sinus tenderness.  Mouth/Throat: Oropharynx is clear and moist.  Eyes: Conjunctivae are normal. Pupils are equal, round, and reactive to light.  Neck: Normal range of motion. Neck supple.  Cardiovascular: Normal rate, regular rhythm, normal heart sounds and intact distal pulses.   No murmur heard. Pulmonary/Chest: Effort normal and breath sounds normal. No respiratory distress. She has no wheezes.  Abdominal: Soft.  Neurological: She is alert and oriented to person, place, and time. Gait normal.  Skin: Skin is warm and dry.  Psychiatric: Mood, memory, affect and judgment normal.    Lab Results  Component Value Date   WBC 4.7 03/21/2014   HGB 12.2 03/21/2014   HCT 37 03/21/2014   PLT 393 03/21/2014   CHOL 215* 03/21/2014   TRIG 96 03/21/2014   HDL 72* 03/21/2014   LDLCALC 124 03/21/2014   TSH 0.04* 12/01/2010    CMP     Component Value Date/Time   NA 141 03/21/2014   K 4.4 03/21/2014   BUN 17 03/21/2014   CREATININE 0.9 03/21/2014   AST 13 03/21/2014   ALT 11 03/21/2014   ALKPHOS 86 03/21/2014    Assessment and Plan :  1. Acute recurrent maxillary sinusitis Treat with Amoxil. Patient advised to rest, push fluids. Follow as needed.  - amoxicillin (AMOXIL) 500 MG capsule; 2 tablets twice daily  Dispense: 28 capsule; Refill: 1 2.AR 3.IBS 4.Migraines I have done the exam and reviewed the above chart and it is accurate to the best of my knowledge.  Patient was seen and examined by Dr. Eulas Post and note was scribed by Theressa Millard, RMA.  Miguel Aschoff MD [facility] 07/30/2015 10:33 AM

## 2015-10-15 ENCOUNTER — Other Ambulatory Visit: Payer: Self-pay

## 2015-10-15 MED ORDER — FOLIC ACID 1 MG PO TABS: 1.0000 mg | ORAL_TABLET | Freq: Every day | ORAL | 3 refills | Status: DC

## 2015-10-17 ENCOUNTER — Other Ambulatory Visit: Payer: Self-pay | Admitting: Emergency Medicine

## 2015-10-23 ENCOUNTER — Other Ambulatory Visit: Payer: Self-pay | Admitting: Family Medicine

## 2015-10-23 MED ORDER — CILIDINIUM-CHLORDIAZEPOXIDE 2.5-5 MG PO CAPS: 1.0000 | ORAL_CAPSULE | Freq: Two times a day (BID) | ORAL | 5 refills | Status: DC

## 2015-10-23 NOTE — Telephone Encounter (Signed)
Ok to rf--#[id number]

## 2015-10-23 NOTE — Telephone Encounter (Signed)
RF called in-aa

## 2015-10-23 NOTE — Telephone Encounter (Signed)
Please review-aa 

## 2015-10-23 NOTE — Telephone Encounter (Signed)
Pt contacted office for refill request on the following medications:  clidinium-chlordiazePOXIDE (LIBRAX) 5-2.5 MG per capsule.  [facility]  [place] [place]  CB#[phone]/MW

## 2015-10-24 ENCOUNTER — Other Ambulatory Visit: Payer: Self-pay

## 2015-10-24 MED ORDER — TOPIRAMATE 50 MG PO TABS
50.0000 mg | ORAL_TABLET | Freq: Every day | ORAL | 3 refills | Status: DC
Start: 2015-10-24 — End: 2016-11-03

## 2015-10-24 MED ORDER — DULOXETINE HCL 60 MG PO CPEP: 60.0000 mg | ORAL_CAPSULE | Freq: Every day | ORAL | 3 refills | Status: DC

## 2015-10-24 MED ORDER — CILIDINIUM-CHLORDIAZEPOXIDE 2.5-5 MG PO CAPS: 1.0000 | ORAL_CAPSULE | Freq: Two times a day (BID) | ORAL | 1 refills | Status: DC

## 2015-10-24 MED ORDER — RABEPRAZOLE SODIUM 20 MG PO TBEC: 20.0000 mg | DELAYED_RELEASE_TABLET | Freq: Two times a day (BID) | ORAL | 3 refills | Status: DC

## 2015-12-09 LAB — HM PAP SMEAR: HM Pap smear: NEGATIVE

## 2015-12-09 LAB — HM MAMMOGRAPHY

## 2016-07-19 ENCOUNTER — Other Ambulatory Visit: Payer: Self-pay | Admitting: Family Medicine

## 2016-09-22 ENCOUNTER — Telehealth: Payer: Self-pay | Admitting: Family Medicine

## 2016-09-22 NOTE — Telephone Encounter (Addendum)
Pt states she will need a prior auth done for Rx RABEprazole (ACIPHEX) 20 MG tablet  2 times a day.  The pharmacy has faxed the prior auth.  Pt ask me to advise  that the prior auth will need to be called in to [phone].  CB#[phone]/MW

## 2016-09-22 NOTE — Telephone Encounter (Signed)
Will work on this, fax from pharmacy was just received about this at 4:46 pm-aa

## 2016-09-22 NOTE — Telephone Encounter (Signed)
Pt is calling back regarding the PA wanting to know if we have heard anything yet.  Thanks, [employer]

## 2016-09-23 NOTE — Telephone Encounter (Signed)
Tried to get PA done but patient's coverage is expired as of 09/22/16. Patient will let us know when she gets insurance. -aa

## 2016-10-14 ENCOUNTER — Encounter: Payer: BLUE CROSS/BLUE SHIELD | Admitting: Family Medicine

## 2016-11-03 ENCOUNTER — Encounter: Payer: Self-pay | Admitting: Family Medicine

## 2016-11-03 ENCOUNTER — Ambulatory Visit (INDEPENDENT_AMBULATORY_CARE_PROVIDER_SITE_OTHER): Payer: BLUE CROSS/BLUE SHIELD | Admitting: Family Medicine

## 2016-11-03 VITALS — BP 104/68 | HR 74 | Temp 97.6°F | Resp 14 | Ht 68.0 in | Wt 158.8 lb

## 2016-11-03 DIAGNOSIS — Z8669 Personal history of other diseases of the nervous system and sense organs: Secondary | ICD-10-CM | POA: Diagnosis not present

## 2016-11-03 DIAGNOSIS — E039 Hypothyroidism, unspecified: Secondary | ICD-10-CM | POA: Diagnosis not present

## 2016-11-03 DIAGNOSIS — Z1159 Encounter for screening for other viral diseases: Secondary | ICD-10-CM

## 2016-11-03 DIAGNOSIS — F419 Anxiety disorder, unspecified: Secondary | ICD-10-CM | POA: Diagnosis not present

## 2016-11-03 DIAGNOSIS — Z0000 Encounter for general adult medical examination without abnormal findings: Secondary | ICD-10-CM | POA: Diagnosis not present

## 2016-11-03 DIAGNOSIS — K219 Gastro-esophageal reflux disease without esophagitis: Secondary | ICD-10-CM | POA: Diagnosis not present

## 2016-11-03 MED ORDER — RABEPRAZOLE SODIUM 20 MG PO TBEC: 20.0000 mg | DELAYED_RELEASE_TABLET | Freq: Two times a day (BID) | ORAL | 3 refills | Status: DC

## 2016-11-03 MED ORDER — TOPIRAMATE 50 MG PO TABS: 50.0000 mg | ORAL_TABLET | Freq: Every day | ORAL | 3 refills | Status: DC

## 2016-11-03 MED ORDER — DULOXETINE HCL 60 MG PO CPEP: 60.0000 mg | ORAL_CAPSULE | Freq: Every day | ORAL | 3 refills | Status: DC

## 2016-11-03 MED ORDER — FOLIC ACID 1 MG PO TABS: 1.0000 mg | ORAL_TABLET | Freq: Every day | ORAL | 3 refills | Status: DC

## 2016-11-03 NOTE — Progress Notes (Signed)
Patient: Crystal Russo, Female    DOB: 1960/12/17, 56 y.o.   MRN: [id number] Visit Date: 11/03/2016  Today's Provider: [name], MD   Chief Complaint  Patient presents with  . Annual Exam   Subjective:  Crystal Russo is a 56 y.o. female who presents today for health maintenance and complete physical. She feels well. She reports exercising no routine regimen of exercises. She reports she is sleeping well.  Last colonoscopy was 10/26/07-internal hemorrhoids, repeat in 10 years. Tdap was 05/05/2006. Last BMD, Mammogram and Pap smear were done with Dr Nori Riis in October 2017.  Review of Systems  Constitutional: Negative.   HENT: Negative.   Eyes: Negative.   Respiratory: Negative.   Cardiovascular: Negative.   Gastrointestinal: Negative.   Endocrine: Negative.   Genitourinary: Negative.   Musculoskeletal: Negative.   Skin: Negative.   Allergic/Immunologic: Negative.   Neurological: Positive for headaches.  Hematological: Negative.   Psychiatric/Behavioral: Negative.     Social History   Social History  . Marital status: Married    Spouse name: N/A  . Number of children: N/A  . Years of education: N/A   Occupational History  . Not on file.   Social History Main Topics  . Smoking status: Never Smoker  . Smokeless tobacco: Never Used  . Alcohol use Yes     Comment: once a month  . Drug use: No  . Sexual activity: Yes    Birth control/ protection: None   Other Topics Concern  . Not on file   Social History Narrative  . No narrative on file    Patient Active Problem List   Diagnosis Date Noted  . Hemorrhoid 05/17/2015  . 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 History of migraine headaches 05/17/2015  . HLD (hyperlipidemia) 05/17/2015  . Adult hypothyroidism 05/17/2015  . 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 Mild major depression ([place]) 05/17/2015  . Multinodular goiter 05/17/2015  . 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 Allergic rhinitis 11/08/2014  . Concussion  injury of body structure 11/08/2014  . Anxiety, generalized 11/08/2014  . Acid reflux 11/08/2014    Past Surgical History:  Procedure Laterality Date  . ABDOMINAL HYSTERECTOMY    . APPENDECTOMY    . BREAST SURGERY     left nipple biopsy  . MANDIBLE SURGERY    . SHOULDER SURGERY    . TUBAL LIGATION    . UPPER GI ENDOSCOPY  10/26/07   reflux esophagitis and a single gastric polyp    Her family history includes Arthritis in her father and mother; Basal cell carcinoma in her mother; Breast cancer in her maternal aunt; Congenital heart disease in her brother; Congestive Heart Failure in her father; Diverticulosis in her father and mother; Fibroids in her mother and sister; Fibromyalgia in her sister; Berenice Primas' disease in her mother; Heart disease in her mother and sister; Hyperlipidemia in her brother; Hypertension in her brother, brother, mother, and sister; Hyperthyroidism in her father; Liver cancer in her paternal grandfather; Lung cancer in her father; Lupus in her sister; Skin cancer in her brother; Stroke in her brother, maternal grandfather, and maternal grandmother; Thyroid cancer in her mother; Ulcers in her father.     Outpatient Encounter Prescriptions as of 11/03/2016  Medication Sig Note  . ALPRAZolam (XANAX) 0.25 MG tablet 1 Every 8 hours as needed.   . Cholecalciferol (VITAMIN D) 2000 units CAPS Take by mouth. 07/30/2015: Received from: [facility]  . clidinium-chlordiazePOXIDE (LIBRAX) 5-2.5 MG capsule TAKE 1 CAPSULE BY  MOUTH TWICE A DAY   . DULoxetine (CYMBALTA) 60 MG capsule Take 1 capsule (60 mg total) by mouth daily.   Marland Kitchen estradiol (VIVELLE-DOT) 0.1 MG/24HR patch Place 1 patch onto the skin 2 (two) times daily.   . fluticasone (FLONASE) 50 MCG/ACT nasal spray USE 2 SPRAYS IN THE NOSE DAILY AS DIRECTED   . folic acid (FOLVITE) 1 MG tablet Take 1 tablet (1 mg total) by mouth daily.   Marland Kitchen ibuprofen (ADVIL,MOTRIN) 800 MG tablet TAKE 1 TABLET TWICE A DAY AS NEEDED   .  RABEprazole (ACIPHEX) 20 MG tablet Take 1 tablet (20 mg total) by mouth 2 (two) times daily.   Marland Kitchen SYNTHROID 150 MCG tablet Take 150 mcg by mouth daily. 07/30/2015: Received from: External Pharmacy  . topiramate (TOPAMAX) 50 MG tablet Take 1 tablet (50 mg total) by mouth daily.   . [DISCONTINUED] DULoxetine (CYMBALTA) 60 MG capsule Take 1 capsule (60 mg total) by mouth daily.   . [DISCONTINUED] folic acid (FOLVITE) 1 MG tablet Take 1 tablet (1 mg total) by mouth daily.   . [DISCONTINUED] RABEprazole (ACIPHEX) 20 MG tablet Take 1 tablet (20 mg total) by mouth 2 (two) times daily.   . [DISCONTINUED] topiramate (TOPAMAX) 50 MG tablet Take 1 tablet (50 mg total) by mouth daily.   . [DISCONTINUED] amoxicillin (AMOXIL) 500 MG capsule 2 tablets twice daily   . [DISCONTINUED] fluconazole (DIFLUCAN) 150 MG tablet Take 1 tablet (150 mg total) by mouth once.   . [DISCONTINUED] MINIVELLE 0.1 MG/24HR patch USE TWICE WEEKLY 07/30/2015: Received from: External Pharmacy  . [DISCONTINUED] TOBRADEX ophthalmic ointment APPLY 0.5 INCH(ES) IN RIGHT EYE 4 TIMES A DAY FOR 14 DAYS 07/30/2015: Received from: External Pharmacy   No facility-administered encounter medications on file as of 11/03/2016.     Patient Care Team: Jerrol Banana., MD as PCP - General (Family Medicine)      Objective:   Vitals:  Vitals:   11/03/16 0937  BP: 104/68  Pulse: 74  Resp: 14  Temp: 97.6 F (36.4 C)  Weight: 158 lb 12.8 oz (72 kg)  Height: 5\' 8"  (1.727 m)    Physical Exam  Constitutional: She is oriented to person, place, and time. She appears well-developed and well-nourished.  HENT:  Head: Normocephalic and atraumatic.  Right Ear: External ear normal.  Left Ear: External ear normal.  Nose: Nose normal.  Mouth/Throat: Oropharynx is clear and moist.  Eyes: Conjunctivae are normal. No scleral icterus.  Neck: Neck supple. No thyromegaly present.  Cardiovascular: Normal rate, regular rhythm, normal heart sounds and  intact distal pulses.   Pulmonary/Chest: Effort normal and breath sounds normal.  Abdominal: Soft.  Lymphadenopathy:    She has no cervical adenopathy.  Neurological: She is alert and oriented to person, place, and time. No cranial nerve deficit. She exhibits normal muscle tone. Coordination normal.  Skin: Skin is warm and dry.  Psychiatric: She has a normal mood and affect. Her behavior is normal. Judgment and thought content normal.     Depression Screen PHQ 2/9 Scores 11/03/2016  PHQ - 2 Score 0  PHQ- 9 Score 0   Assessment & Plan:   1. Annual physical exam -Records requested from Dr Neal-gyn for last pap smear, last mammogram and BMD from 2017 per patient.  Patient is due for Td and Flu shot, patient will come back once her insurance is activated or may go to the pharmacy. Lipid Profile - CBC with Differential/Platelet - Comprehensive metabolic panel  2.  Gastroesophageal reflux disease, esophagitis presence not specified 3. Acute anxiety - Lipid Profile - CBC with Differential/Platelet - Comprehensive metabolic panel  4. Adult hypothyroidism 5. History of migraine headaches  6. Need for hepatitis C screening test - Hepatitis C Antibody 7.Allergic Rhinitis Try monteleukast. 8.IBS Try Glycolax. HPI, Exam and A&P transcribed by Tiffany Kocher, RMA under direction and in the presence of Miguel Aschoff, MD.  Discussed health benefits of physical activity, and encouraged her to engage in regular exercise appropriate for her age and condition.   I have done the exam and reviewed the chart and it is accurate to the best of my knowledge. Development worker, community has been used and  any errors in dictation or transcription are unintentional. Miguel Aschoff M.D. [facility]

## 2016-11-05 ENCOUNTER — Telehealth: Payer: Self-pay | Admitting: Family Medicine

## 2016-11-05 MED ORDER — MONTELUKAST SODIUM 10 MG PO TABS: 10.0000 mg | ORAL_TABLET | Freq: Every day | ORAL | 12 refills | Status: DC

## 2016-11-05 NOTE — Telephone Encounter (Signed)
Ok to do?-Anastasiya V Hopkins, RMA  

## 2016-11-05 NOTE — Telephone Encounter (Signed)
Done, patient Crystal Russo, RMA

## 2016-11-05 NOTE — Telephone Encounter (Signed)
10mg  daily--thx

## 2016-11-05 NOTE — Telephone Encounter (Signed)
Pt states Dr Rosanna Randy was going to give her a Rx for Singulair. Pt has checked with the pharmacy and they have not rec'd a Rx.  [facility] [address] [place], [place]  [address].  CB#[phone]/MW

## 2016-11-13 ENCOUNTER — Encounter: Payer: Self-pay | Admitting: Obstetrics & Gynecology

## 2016-11-25 ENCOUNTER — Other Ambulatory Visit: Payer: Self-pay | Admitting: Family Medicine

## 2016-11-26 LAB — COMPREHENSIVE METABOLIC PANEL
ALBUMIN: 4.3 g/dL (ref 3.5–5.5)
ALT: 17 IU/L (ref 0–32)
AST: 17 IU/L (ref 0–40)
Albumin/Globulin Ratio: 1.5 (ref 1.2–2.2)
Alkaline Phosphatase: 88 IU/L (ref 39–117)
BUN/Creatinine Ratio: 15 (ref 9–23)
BUN: 14 mg/dL (ref 6–24)
Bilirubin Total: 0.2 mg/dL (ref 0.0–1.2)
CO2: 23 mmol/L (ref 20–29)
CREATININE: 0.91 mg/dL (ref 0.57–1.00)
Calcium: 10.1 mg/dL (ref 8.7–10.2)
Chloride: 103 mmol/L (ref 96–106)
GFR calc non Af Amer: 71 mL/min/{1.73_m2} (ref >59)
GFR, EST AFRICAN AMERICAN: 82 mL/min/{1.73_m2} (ref >59)
GLUCOSE: 94 mg/dL (ref 65–99)
Globulin, Total: 2.9 g/dL (ref 1.5–4.5)
POTASSIUM: 4.6 mmol/L (ref 3.5–5.2)
SODIUM: 138 mmol/L (ref 134–144)
TOTAL PROTEIN: 7.2 g/dL (ref 6.0–8.5)

## 2016-11-26 LAB — CBC/DIFF AMBIGUOUS DEFAULT
BASOS ABS: 0.1 10*3/uL (ref 0.0–0.2)
Basos: 1 %
EOS (ABSOLUTE): 0.1 10*3/uL (ref 0.0–0.4)
Eos: 2 %
HEMOGLOBIN: 12.6 g/dL (ref 11.1–15.9)
Hematocrit: 38.9 % (ref 34.0–46.6)
IMMATURE GRANS (ABS): 0 10*3/uL (ref 0.0–0.1)
Immature Granulocytes: 0 %
LYMPHS: 45 %
Lymphocytes Absolute: 2.2 10*3/uL (ref 0.7–3.1)
MCH: 28.4 pg (ref 26.6–33.0)
MCHC: 32.4 g/dL (ref 31.5–35.7)
MCV: 88 fL (ref 79–97)
MONOCYTES: 9 %
Monocytes Absolute: 0.4 10*3/uL (ref 0.1–0.9)
Neutrophils Absolute: 2.1 10*3/uL (ref 1.4–7.0)
Neutrophils: 43 %
Platelets: 353 10*3/uL (ref 150–379)
RBC: 4.44 x10E6/uL (ref 3.77–5.28)
RDW: 14.4 % (ref 12.3–15.4)
WBC: 4.9 10*3/uL (ref 3.4–10.8)

## 2016-11-26 LAB — AMBIG ABBREV CMP14 DEFAULT

## 2016-11-26 LAB — LIPID PANEL W/O CHOL/HDL RATIO
Cholesterol, Total: 235 mg/dL — ABNORMAL HIGH (ref 100–199)
HDL: 76 mg/dL (ref >39)
LDL CALC: 144 mg/dL — AB (ref 0–99)
Triglycerides: 74 mg/dL (ref 0–149)
VLDL Cholesterol Cal: 15 mg/dL (ref 5–40)

## 2016-11-26 LAB — HEPATITIS C ANTIBODY

## 2016-11-26 LAB — AMBIG ABBREV LP DEFAULT

## 2016-11-30 ENCOUNTER — Telehealth: Payer: Self-pay

## 2016-11-30 NOTE — Telephone Encounter (Signed)
Pt advised.   Thanks,   -Laura  

## 2016-11-30 NOTE — Telephone Encounter (Signed)
-----   Message from Jerrol Banana., MD sent at 11/30/2016  8:19 AM EDT ----- Madaline Brilliant

## 2017-02-13 ENCOUNTER — Other Ambulatory Visit: Payer: Self-pay | Admitting: Family Medicine

## 2017-03-09 ENCOUNTER — Encounter: Payer: Self-pay | Admitting: Family Medicine

## 2017-03-09 ENCOUNTER — Ambulatory Visit: Payer: BLUE CROSS/BLUE SHIELD | Admitting: Family Medicine

## 2017-03-09 VITALS — BP 104/66 | HR 98 | Temp 98.0°F | Wt 169.0 lb

## 2017-03-09 DIAGNOSIS — J0100 Acute maxillary sinusitis, unspecified: Secondary | ICD-10-CM | POA: Diagnosis not present

## 2017-03-09 MED ORDER — AMOXICILLIN 875 MG PO TABS: 875.0000 mg | ORAL_TABLET | Freq: Two times a day (BID) | ORAL | 0 refills | Status: DC

## 2017-03-09 NOTE — Progress Notes (Signed)
Patient: Crystal Russo Female    DOB: [birth year]   57 y.o.   MRN: [id number] Visit Date: 03/09/2017  Today's Provider: Vernie Murders, PA   Chief Complaint  Patient presents with  . Sinusitis   Subjective:    Sinusitis  This is a new problem. Episode onset: Saturday. The problem has been gradually worsening since onset. There has been no fever. Associated symptoms include coughing, sinus pressure and sneezing. Ear pain: ear drainage  (Bloody mucus, and swollen nasal passage ) Treatments tried: Flonase and Sudafed PE  The treatment provided mild relief.   Past Medical History:  Diagnosis Date  . Cancer [facility])    skin cancer   Past Surgical History:  Procedure Laterality Date  . ABDOMINAL HYSTERECTOMY    . APPENDECTOMY    . BREAST SURGERY     left nipple biopsy  . MANDIBLE SURGERY    . SHOULDER SURGERY    . TUBAL LIGATION    . UPPER GI ENDOSCOPY  10/26/07   reflux esophagitis and a single gastric polyp   Family History  Problem Relation Age of Onset  . Hypertension Mother   . Heart disease Mother   . Arthritis Mother   . Graves' disease Mother   . Thyroid cancer Mother   . Fibroids Mother   . Diverticulosis Mother   . Basal cell carcinoma Mother   . Ulcers Father   . Hyperthyroidism Father   . Arthritis Father   . Congestive Heart Failure Father   . Diverticulosis Father   . Lung cancer Father   . Fibroids Sister   . Hypertension Sister   . Fibromyalgia Sister   . Heart disease Sister   . Lupus Sister   . Hypertension Brother   . Hyperlipidemia Brother   . Skin cancer Brother   . Congenital heart disease Brother   . Stroke Brother   . Hypertension Brother   . Breast cancer Maternal Aunt   . Stroke Maternal Grandmother   . Stroke Maternal Grandfather   . Liver cancer Paternal Grandfather    No Known Allergies  Current Outpatient Medications:  .  ALPRAZolam (XANAX) 0.25 MG tablet, 1 Every 8 hours as needed., Disp: 55 tablet, Rfl: 1 .  Cholecalciferol  (VITAMIN D) 2000 units CAPS, Take by mouth., Disp: , Rfl:  .  clidinium-chlordiazePOXIDE (LIBRAX) 5-2.5 MG capsule, TAKE 1 CAPSULE BY MOUTH TWICE A DAY, Disp: 180 capsule, Rfl: 1 .  DULoxetine (CYMBALTA) 60 MG capsule, Take 1 capsule (60 mg total) by mouth daily., Disp: 90 capsule, Rfl: 3 .  estradiol (VIVELLE-DOT) 0.1 MG/24HR patch, Place 1 patch onto the skin 2 (two) times daily., Disp: , Rfl: 12 .  fluticasone (FLONASE) 50 MCG/ACT nasal spray, USE 2 SPRAYS IN THE NOSE DAILY AS DIRECTED, Disp: 16 g, Rfl: 5 .  folic acid (FOLVITE) 1 MG tablet, Take 1 tablet (1 mg total) by mouth daily., Disp: 90 tablet, Rfl: 3 .  ibuprofen (ADVIL,MOTRIN) 800 MG tablet, TAKE 1 TABLET TWICE A DAY AS NEEDED, Disp: 60 tablet, Rfl: 12 .  montelukast (SINGULAIR) 10 MG tablet, Take 1 tablet (10 mg total) by mouth at bedtime., Disp: 30 tablet, Rfl: 12 .  RABEprazole (ACIPHEX) 20 MG tablet, Take 1 tablet (20 mg total) by mouth 2 (two) times daily., Disp: 180 tablet, Rfl: 3 .  SYNTHROID 150 MCG tablet, Take 150 mcg by mouth daily., Disp: , Rfl: 5 .  topiramate (TOPAMAX) 50 MG tablet, Take 1 tablet (50  mg total) by mouth daily., Disp: 90 tablet, Rfl: 3  Review of Systems  HENT: Positive for ear discharge, sinus pressure and sneezing. Ear pain: ear drainage    Respiratory: Positive for cough.    Social History   Tobacco Use  . Smoking status: Never Smoker  . Smokeless tobacco: Never Used  Substance Use Topics  . Alcohol use: Yes    Comment: once a month   Objective:   BP 104/66 (BP Location: Right Arm, Patient Position: Sitting, Cuff Size: Normal)   Pulse 98   Temp 98 F (36.7 C) (Oral)   Wt 169 lb (76.7 kg)   SpO2 99%   BMI 25.70 kg/m    Physical Exam  Constitutional: She is oriented to person, place, and time. She appears well-developed and well-nourished.  HENT:  Head: Normocephalic.  Slightly cloudy right maxillary sinus and tenderness bilateral maxillary sinuses. Slightly reddened nasal membranes.  Some cobblestoning of posterior pharynx without exudates. TM's with good reflex.  Eyes: Conjunctivae are normal.  Neck: Neck supple.  Cardiovascular: Normal rate and regular rhythm.  Pulmonary/Chest: Effort normal and breath sounds normal.  Abdominal: Soft. Bowel sounds are normal.  Lymphadenopathy:    She has no cervical adenopathy.  Neurological: She is alert and oriented to person, place, and time.      Assessment & Plan:     1. Acute maxillary sinusitis, recurrence not specified Onset 3 days ago with nasal congestion and some bloody mucus. Progressed to sinus tenderness but no fever. Will treat with Amoxil, Flonase nasal spray and may add Mucinex-D or continue Sudafed. Increase fluid intake and use Tylenol prn. Recheck if no better in 5-7 days. - amoxicillin (AMOXIL) 875 MG tablet; Take 1 tablet (875 mg total) by mouth 2 (two) times daily.  Dispense: 20 tablet; Refill: [place], PA  [facility]

## 2017-04-13 ENCOUNTER — Telehealth: Payer: Self-pay | Admitting: Family Medicine

## 2017-04-13 NOTE — Telephone Encounter (Signed)
[facility] [place] a refill request for the following medication. Thanks CC  clidinium-chlordiazePOXIDE (LIBRAX) 5-2.5 MG capsule

## 2017-04-14 NOTE — Telephone Encounter (Signed)
5 rf 

## 2017-04-16 ENCOUNTER — Other Ambulatory Visit: Payer: Self-pay | Admitting: Emergency Medicine

## 2017-04-16 MED ORDER — CILIDINIUM-CHLORDIAZEPOXIDE 2.5-5 MG PO CAPS: 1.0000 | ORAL_CAPSULE | Freq: Two times a day (BID) | ORAL | 1 refills | Status: DC

## 2017-04-16 NOTE — Telephone Encounter (Signed)
Phoned in.

## 2017-04-16 NOTE — Telephone Encounter (Signed)
error 

## 2017-05-05 ENCOUNTER — Telehealth: Payer: Self-pay | Admitting: Family Medicine

## 2017-05-05 NOTE — Telephone Encounter (Signed)
Pt is scheduled for Td shot only on 05/14/17 @ 8:50 am. Please advise. Thanks TNP

## 2017-05-14 ENCOUNTER — Ambulatory Visit (INDEPENDENT_AMBULATORY_CARE_PROVIDER_SITE_OTHER): Payer: BLUE CROSS/BLUE SHIELD | Admitting: Physician Assistant

## 2017-05-14 DIAGNOSIS — Z23 Encounter for immunization: Secondary | ICD-10-CM

## 2017-05-14 NOTE — Patient Instructions (Signed)
Tetanus Toxoid Adsorbed injection What is this medicine? TETANUS TOXOID (TET n uhs tok soid) is a vaccine. It is used to prevent infections of tetanus (lockjaw). This medicine may be used for other purposes; ask your health care provider or pharmacist if you have questions. What should I tell my health care provider before I take this medicine? They need to know if you have any of these conditions: -bleeding disorder -immune system problems -infection with fever -low levels of platelets in the blood -an unusual or allergic reaction to tetanus toxoid, vaccines, latex, thimerosal, aluminium, other medicines, foods, dyes, or preservatives -pregnant or trying to get pregnant -breast-feeding How should I use this medicine? This vaccine is for injection into a muscle. It is given by a health care professional. A copy of Vaccine Information Statements will be given before each vaccination. Read this sheet carefully each time. The sheet may change frequently. Talk to your pediatrician regarding the use of this medicine in children. While this drug may be prescribed for children as young as 87 years of age for selected conditions, precautions do apply. Overdosage: If you think you have taken too much of this medicine contact a poison control center or emergency room at once. NOTE: This medicine is only for you. Do not share this medicine with others. What if I miss a dose? Keep appointments for follow-up (booster) doses as directed. It is important not to miss your dose. Call your doctor or health care professional if you are unable to keep an appointment. What may interact with this medicine? -adalimumab -anakinra -infliximab -medicines that suppress your immune system -medicines to treat cancer -steroid medicines like prednisone or cortisone This list may not describe all possible interactions. Give your health care provider a list of all the medicines, herbs, non-prescription drugs, or dietary  supplements you use. Also tell them if you smoke, drink alcohol, or use illegal drugs. Some items may interact with your medicine. What should I watch for while using this medicine? Report any adverse reaction following administration to your health care provider. Contact your doctor or health care professional and seek emergency medical care if any serious side effects occur. This vaccine, like all vaccines, may not fully protect everyone. What side effects may I notice from receiving this medicine? Side effects that you should report to your doctor or health care professional as soon as possible: -allergic reactions like skin rash, itching or hives, swelling of the face, lips, or tongue -arthritis pain -breathing problems -extreme changes in behavior -fast, irregular heartbeat -fever over 100 degrees F -pain, tingling, numbness in the hands or feet -seizures -unusually weak or tired Side effects that usually do not require medical attention (report to your doctor or health care professional if they continue or are bothersome): -aches or pains -bruising, pain, swelling at site where injected -low-grade fever of 100 degrees F or less -nausea This list may not describe all possible side effects. Call your doctor for medical advice about side effects. You may report side effects to FDA at 1-800-FDA-1088. Where should I keep my medicine? This drug is given in a hospital or clinic and will not be stored at home. NOTE: This sheet is a summary. It may not cover all possible information. If you have questions about this medicine, talk to your doctor, pharmacist, or health care provider.  2018 Elsevier/Gold Standard (2007-10-27 11:28:32)

## 2017-05-14 NOTE — Progress Notes (Signed)
Patient received Td shot and remained ten minutes for observation. Tolerated vaccination well.

## 2017-07-10 ENCOUNTER — Other Ambulatory Visit: Payer: Self-pay | Admitting: Family Medicine

## 2017-07-13 ENCOUNTER — Telehealth: Payer: Self-pay | Admitting: Family Medicine

## 2017-07-13 NOTE — Telephone Encounter (Signed)
Patient is going to get me a list of the medicines she has tried in the past so when we get the PA request I can fill it out.

## 2017-07-13 NOTE — Telephone Encounter (Signed)
Pt needs a nurse to call her back regarding medication for reflux and has been on it for 15 years.  The pharmacy always tries to get her to change but she does not want to  She needs a PA for a refill.  Patients call back is [phone]  Thanks teri

## 2017-07-16 NOTE — Telephone Encounter (Signed)
Pt state she went to the pharmacy and they states they only go back 18 mths.  She states the last time she was give Protonix she was given was a little over 18 mths.  States you will have to use the Protonix in her record here.

## 2017-07-18 ENCOUNTER — Other Ambulatory Visit: Payer: Self-pay | Admitting: Family Medicine

## 2017-07-20 NOTE — Telephone Encounter (Signed)
Patient needs PA started for Protonix.  No previous medications tried noted.

## 2017-07-21 NOTE — Telephone Encounter (Signed)
Pt does not want the Protonix.  She only wants the Aciphex 20mg .2 times a day  Pt says the nothing else helps and she has used the protonix and it does not work for her.  Pt's call back is [phone]  [place]

## 2017-07-21 NOTE — Telephone Encounter (Signed)
Please review and advise. KW 

## 2017-07-21 NOTE — Telephone Encounter (Signed)
Ok to change to aciphex.

## 2017-07-28 ENCOUNTER — Telehealth: Payer: Self-pay | Admitting: Family Medicine

## 2017-07-28 NOTE — Telephone Encounter (Signed)
Pt called stating since we haven't received PA form from pharmacy, she contacted her insurance and was advised to have our office call [phone] which is the PA department for [facility]. Please advise. Thanks TNP

## 2017-07-29 NOTE — Telephone Encounter (Signed)
PA was approved. They will fax over approval letter shortly.

## 2017-07-29 NOTE — Telephone Encounter (Signed)
Advised patient

## 2017-07-30 ENCOUNTER — Telehealth: Payer: Self-pay | Admitting: Family Medicine

## 2017-07-30 NOTE — Telephone Encounter (Signed)
Pt called saying the PA was approved for her to take the Aciphex 20 mg 1 time a day but she needs a PA for her to take 2o mg BID.  The number for the PA dept for [facility] is [phone]  [facility] uses [facility].  [facility] is  [phone]  Pt's call [phone]

## 2017-10-04 ENCOUNTER — Ambulatory Visit (INDEPENDENT_AMBULATORY_CARE_PROVIDER_SITE_OTHER): Payer: BLUE CROSS/BLUE SHIELD | Admitting: Family Medicine

## 2017-10-04 ENCOUNTER — Encounter: Payer: Self-pay | Admitting: Family Medicine

## 2017-10-04 ENCOUNTER — Ambulatory Visit
Admission: RE | Admit: 2017-10-04 | Discharge: 2017-10-04 | Disposition: A | Discharge status: Home / Self Care | Payer: BLUE CROSS/BLUE SHIELD | Source: Ambulatory Visit | Attending: Family Medicine | Admitting: Family Medicine

## 2017-10-04 VITALS — BP 108/62 | HR 76 | Temp 98.0°F | Wt 170.0 lb

## 2017-10-04 DIAGNOSIS — S99921A Unspecified injury of right foot, initial encounter: Secondary | ICD-10-CM | POA: Diagnosis not present

## 2017-10-04 DIAGNOSIS — S99922A Unspecified injury of left foot, initial encounter: Secondary | ICD-10-CM | POA: Diagnosis not present

## 2017-10-04 DIAGNOSIS — M7989 Other specified soft tissue disorders: Secondary | ICD-10-CM | POA: Insufficient documentation

## 2017-10-04 DIAGNOSIS — M25571 Pain in right ankle and joints of right foot: Secondary | ICD-10-CM | POA: Insufficient documentation

## 2017-10-04 DIAGNOSIS — D649 Anemia, unspecified: Secondary | ICD-10-CM

## 2017-10-04 NOTE — Progress Notes (Signed)
Patient: Crystal Russo Female    DOB: October 26, 1960   57 y.o.   MRN: [id number] Visit Date: 10/04/2017  Today's Provider: [name], MD   Chief Complaint  Patient presents with  . Foot Pain    Happened about three days ago.    Subjective:    Foot Pain  This is a new problem. The current episode started in the past 7 days. The problem has been unchanged. Associated symptoms include arthralgias, joint swelling and myalgias. Pertinent negatives include no neck pain, numbness or weakness. She has tried ice and NSAIDs for the symptoms. The treatment provided mild relief.     No Known Allergies   Current Outpatient Medications:  .  ALPRAZolam (XANAX) 0.25 MG tablet, 1 Every 8 hours as needed., Disp: 55 tablet, Rfl: 1 .  Cholecalciferol (VITAMIN D) 2000 units CAPS, Take by mouth., Disp: , Rfl:  .  clidinium-chlordiazePOXIDE (LIBRAX) 5-2.5 MG capsule, TAKE 1 CAPSULE TWICE A DAY, Disp: 180 capsule, Rfl: 3 .  DULoxetine (CYMBALTA) 60 MG capsule, TAKE ONE CAPSULE BY MOUTH EVERY DAY, Disp: 90 capsule, Rfl: 3 .  estradiol (VIVELLE-DOT) 0.1 MG/24HR patch, Place 1 patch onto the skin 2 (two) times daily., Disp: , Rfl: 12 .  fluticasone (FLONASE) 50 MCG/ACT nasal spray, USE 2 SPRAYS IN THE NOSE DAILY AS DIRECTED, Disp: 16 g, Rfl: 5 .  folic acid (FOLVITE) 1 MG tablet, Take 1 tablet (1 mg total) by mouth daily., Disp: 90 tablet, Rfl: 3 .  ibuprofen (ADVIL,MOTRIN) 800 MG tablet, TAKE 1 TABLET TWICE A DAY AS NEEDED, Disp: 60 tablet, Rfl: 12 .  RABEprazole (ACIPHEX) 20 MG tablet, Take 1 tablet (20 mg total) by mouth 2 (two) times daily., Disp: 180 tablet, Rfl: 3 .  SYNTHROID 150 MCG tablet, Take 150 mcg by mouth daily., Disp: , Rfl: 5 .  topiramate (TOPAMAX) 50 MG tablet, Take 1 tablet (50 mg total) by mouth daily., Disp: 90 tablet, Rfl: 3 .  amoxicillin (AMOXIL) 875 MG tablet, Take 1 tablet (875 mg total) by mouth 2 (two) times daily., Disp: 20 tablet, Rfl: 0 .  montelukast (SINGULAIR)  10 MG tablet, Take 1 tablet (10 mg total) by mouth at bedtime. (Patient not taking: Reported on 10/04/2017), Disp: 30 tablet, Rfl: 12  Review of Systems  Constitutional: Negative.   Eyes: Negative.   Respiratory: Negative.   Cardiovascular: Negative.   Gastrointestinal: Negative.   Musculoskeletal: Positive for arthralgias, gait problem, joint swelling and myalgias. Negative for back pain, neck pain and neck stiffness.  Allergic/Immunologic: Negative.   Neurological: Negative for weakness and numbness.  Psychiatric/Behavioral: Negative.     Social History   Tobacco Use  . Smoking status: Never Smoker  . Smokeless tobacco: Never Used  Substance Use Topics  . Alcohol use: Yes    Comment: once a month   Objective:   There were no vitals taken for this visit. There were no vitals filed for this visit.   Physical Exam  Constitutional: She is oriented to person, place, and time. She appears well-developed and well-nourished.  HENT:  Head: Normocephalic and atraumatic.  Right Ear: External ear normal.  Left Ear: External ear normal.  Nose: Nose normal.  Eyes: Conjunctivae are normal. No scleral icterus.  Neck: No thyromegaly present.  Cardiovascular: Normal rate, regular rhythm and normal heart sounds.  Pulmonary/Chest: Effort normal and breath sounds normal.  Abdominal: Soft.  Musculoskeletal: She exhibits no edema.  Tenderness on dorsum of left  foot with swelling in  This area.  Neurological: She is alert and oriented to person, place, and time.  Skin: Skin is warm and dry.  Psychiatric: She has a normal mood and affect. Her behavior is normal. Judgment and thought content normal.        Assessment & Plan:     1. Swelling of right foot due to trauma  - DG Foot Complete Right; Future  2. Anemia, unspecified type  - Ambulatory referral to Gastroenterology 3.Superficial laceration Of dorsum of foot Healing well.        I have done the exam and reviewed the  above chart and it is accurate to the best of my knowledge. Development worker, community has been used in this note in any air is in the dictation or transcription are unintentional.  [name], MD  [place]

## 2017-10-07 ENCOUNTER — Telehealth: Payer: Self-pay

## 2017-10-07 NOTE — Telephone Encounter (Signed)
-----   Message from Jerrol Banana., MD sent at 10/06/2017  9:00 AM EDT ----- Probable nondisplaced fracture--if continuing to improve would plan to RTC 10-20days to recheck and repeat Xray

## 2017-10-07 NOTE — Telephone Encounter (Signed)
Pt advised.  Apt for 10/18/2017 at 2:20  Thanks,   -Mickel Baas

## 2017-10-18 ENCOUNTER — Ambulatory Visit (INDEPENDENT_AMBULATORY_CARE_PROVIDER_SITE_OTHER): Payer: BLUE CROSS/BLUE SHIELD | Admitting: Family Medicine

## 2017-10-18 ENCOUNTER — Encounter: Payer: Self-pay | Admitting: Family Medicine

## 2017-10-18 VITALS — BP 110/62 | HR 90 | Temp 98.4°F | Resp 16 | Ht 69.0 in | Wt 172.0 lb

## 2017-10-18 DIAGNOSIS — S92901S Unspecified fracture of right foot, sequela: Secondary | ICD-10-CM | POA: Diagnosis not present

## 2017-10-18 DIAGNOSIS — E039 Hypothyroidism, unspecified: Secondary | ICD-10-CM | POA: Diagnosis not present

## 2017-10-18 NOTE — Progress Notes (Signed)
Patient: Crystal Russo Female    DOB: [birth year]   56 y.o.   MRN: [id number] Visit Date: 10/18/2017  Today's Provider: [name], MD   Chief Complaint  Patient presents with  . Follow-up   Subjective:    HPI Patient is a 57 year old female coming in today for a follow up. She was last seen in the office on 10/04/2017 due to swelling in her right foot. Her x-ray indicated that she had a probable nondisplaced fracture in her right foot. Patient reports that she does still have bruising, and she is taking ibuprofen for pain which helps.    Her fracture is over first MTP but discomfort is over dorsum of midfoot. No pain with weight bearing over fracture site.  No Known Allergies   Current Outpatient Medications:  .  ALPRAZolam (XANAX) 0.25 MG tablet, 1 Every 8 hours as needed., Disp: 55 tablet, Rfl: 1 .  amoxicillin (AMOXIL) 875 MG tablet, Take 1 tablet (875 mg total) by mouth 2 (two) times daily., Disp: 20 tablet, Rfl: 0 .  betamethasone dipropionate (DIPROLENE) 0.05 % ointment, Apply topically 2 (two) times daily., Disp: , Rfl:  .  Cholecalciferol (VITAMIN D) 2000 units CAPS, Take by mouth., Disp: , Rfl:  .  clidinium-chlordiazePOXIDE (LIBRAX) 5-2.5 MG capsule, TAKE 1 CAPSULE TWICE A DAY, Disp: 180 capsule, Rfl: 3 .  DULoxetine (CYMBALTA) 60 MG capsule, TAKE ONE CAPSULE BY MOUTH EVERY DAY, Disp: 90 capsule, Rfl: 3 .  estradiol (VIVELLE-DOT) 0.1 MG/24HR patch, Place 1 patch onto the skin 2 (two) times daily., Disp: , Rfl: 12 .  FERREX 150 150 MG capsule, Take 150 mg by mouth daily., Disp: , Rfl: 0 .  fluticasone (FLONASE) 50 MCG/ACT nasal spray, USE 2 SPRAYS IN THE NOSE DAILY AS DIRECTED, Disp: 16 g, Rfl: 5 .  folic acid (FOLVITE) 1 MG tablet, Take 1 tablet (1 mg total) by mouth daily., Disp: 90 tablet, Rfl: 3 .  ibuprofen (ADVIL,MOTRIN) 800 MG tablet, TAKE 1 TABLET TWICE A DAY AS NEEDED, Disp: 60 tablet, Rfl: 12 .  RABEprazole (ACIPHEX) 20 MG tablet, Take 1 tablet (20 mg  total) by mouth 2 (two) times daily., Disp: 180 tablet, Rfl: 3 .  SYNTHROID 150 MCG tablet, Take 150 mcg by mouth daily., Disp: , Rfl: 5 .  topiramate (TOPAMAX) 50 MG tablet, Take 1 tablet (50 mg total) by mouth daily., Disp: 90 tablet, Rfl: 3 .  triamcinolone cream (KENALOG) 0.1 %, Apply 1 application topically 2 (two) times daily., Disp: , Rfl:  .  metroNIDAZOLE (METROGEL) 0.75 % gel, Apply 1 application topically 2 (two) times daily., Disp: , Rfl:  .  montelukast (SINGULAIR) 10 MG tablet, Take 1 tablet (10 mg total) by mouth at bedtime. (Patient not taking: Reported on 10/04/2017), Disp: 30 tablet, Rfl: 12  Review of Systems  Constitutional: Negative for activity change, appetite change, chills, diaphoresis, fatigue, fever and unexpected weight change.  Eyes: Negative.   Respiratory: Negative for cough and shortness of breath.   Cardiovascular: Negative for chest pain, palpitations and leg swelling.  Endocrine: Negative.   Musculoskeletal: Negative for arthralgias, back pain, gait problem, joint swelling and myalgias.  Allergic/Immunologic: Negative.   Neurological: Negative.   Psychiatric/Behavioral: Negative.     Social History   Tobacco Use  . Smoking status: Never Smoker  . Smokeless tobacco: Never Used  Substance Use Topics  . Alcohol use: Yes    Comment: once a month  Objective:   BP 110/62 (BP Location: Left Arm, Patient Position: Sitting, Cuff Size: Normal)   Pulse 90   Temp 98.4 F (36.9 C)   Resp 16   Ht 5\' 9"  (1.753 m)   Wt 172 lb (78 kg)   SpO2 99%   BMI 25.40 kg/m  Vitals:   10/18/17 1427  BP: 110/62  Pulse: 90  Resp: 16  Temp: 98.4 F (36.9 C)  SpO2: 99%  Weight: 172 lb (78 kg)  Height: 5\' 9"  (1.753 m)     Physical Exam  Constitutional: She is oriented to person, place, and time. She appears well-developed and well-nourished.  HENT:  Head: Normocephalic and atraumatic.  Eyes: Conjunctivae are normal.  Neck: No thyromegaly present.    Cardiovascular: Normal rate and regular rhythm.  Pulmonary/Chest: Effort normal.  Musculoskeletal: She exhibits no edema.  Neurological: She is alert and oriented to person, place, and time.  Skin: Skin is warm and dry.  Psychiatric: She has a normal mood and affect. Her behavior is normal. Judgment and thought content normal.        Assessment & Plan:     Foot Trauma with fracture Continue present care. I do not think she needs ortho/podiatry referral. Hypothyroid Per Dr Forde Dandy.     I have done the exam and reviewed the above chart and it is accurate to the best of my knowledge. Development worker, community has been used in this note in any air is in the dictation or transcription are unintentional.  [name], MD  [place]

## 2017-10-25 DIAGNOSIS — S92901S Unspecified fracture of right foot, sequela: Secondary | ICD-10-CM | POA: Insufficient documentation

## 2017-10-28 ENCOUNTER — Other Ambulatory Visit: Payer: Self-pay | Admitting: Family Medicine

## 2017-11-02 ENCOUNTER — Other Ambulatory Visit: Payer: Self-pay | Admitting: Family Medicine

## 2017-11-15 ENCOUNTER — Ambulatory Visit (INDEPENDENT_AMBULATORY_CARE_PROVIDER_SITE_OTHER): Payer: BLUE CROSS/BLUE SHIELD | Admitting: Family Medicine

## 2017-11-15 ENCOUNTER — Encounter: Payer: Self-pay | Admitting: Family Medicine

## 2017-11-15 VITALS — BP 114/64 | HR 81 | Temp 98.5°F | Wt 170.0 lb

## 2017-11-15 DIAGNOSIS — J301 Allergic rhinitis due to pollen: Secondary | ICD-10-CM

## 2017-11-15 DIAGNOSIS — E785 Hyperlipidemia, unspecified: Secondary | ICD-10-CM

## 2017-11-15 DIAGNOSIS — K449 Diaphragmatic hernia without obstruction or gangrene: Secondary | ICD-10-CM

## 2017-11-15 DIAGNOSIS — Z0000 Encounter for general adult medical examination without abnormal findings: Secondary | ICD-10-CM

## 2017-11-15 DIAGNOSIS — K219 Gastro-esophageal reflux disease without esophagitis: Secondary | ICD-10-CM

## 2017-11-15 MED ORDER — RABEPRAZOLE SODIUM 20 MG PO TBEC: 20.0000 mg | DELAYED_RELEASE_TABLET | Freq: Two times a day (BID) | ORAL | 3 refills | Status: DC

## 2017-11-15 NOTE — Progress Notes (Signed)
Patient: Crystal Russo, Female    DOB: [birth year], 57 y.o.   MRN: [id number] Visit Date: 11/15/2017  Today's Provider: [name], MD   Chief Complaint  Patient presents with  . Annual Exam  . Sinusitis    Started about two days ago.   Subjective:    Annual physical exam Crystal Russo is a 57 y.o. female who presents today for health maintenance and complete physical. She feels well. She reports exercising not recently secondary to foot fracture.  She has been walking some.  She reports she is sleeping well.  -----------------------------------------------------------------  Sinusitis  This is a new problem. The current episode started in the past 7 days. The problem has been gradually worsening since onset. There has been no fever. Associated symptoms include congestion, headaches and sinus pressure. Pertinent negatives include no chills, coughing, diaphoresis, ear pain, hoarse voice, neck pain, shortness of breath, sneezing, sore throat or swollen glands.     Review of Systems  Constitutional: Negative.  Negative for chills and diaphoresis.  HENT: Positive for congestion, postnasal drip and sinus pressure. Negative for dental problem, drooling, ear discharge, ear pain, facial swelling, hearing loss, hoarse voice, mouth sores, nosebleeds, rhinorrhea, sinus pain, sneezing, sore throat, tinnitus, trouble swallowing and voice change.   Eyes: Negative.   Respiratory: Negative.  Negative for cough and shortness of breath.   Cardiovascular: Negative.   Gastrointestinal: Negative.   Endocrine: Negative.   Genitourinary: Negative.   Musculoskeletal: Negative.  Negative for neck pain.  Skin: Negative.   Allergic/Immunologic: Negative.   Neurological: Positive for headaches. Negative for dizziness, tremors, seizures, syncope, facial asymmetry, speech difficulty, weakness, light-headedness and numbness.  Hematological: Negative.   Psychiatric/Behavioral: Negative.      Social History      She  reports that she has never smoked. She has never used smokeless tobacco. She reports that she drinks alcohol. She reports that she does not use drugs.       Social History   Socioeconomic History  . Marital status: Married    Spouse name: Not on file  . Number of children: Not on file  . Years of education: Not on file  . Highest education level: Not on file  Occupational History  . Not on file  Social Needs  . Financial resource strain: Not on file  . Food insecurity:    Worry: Not on file    Inability: Not on file  . Transportation needs:    Medical: Not on file    Non-medical: Not on file  Tobacco Use  . Smoking status: Never Smoker  . Smokeless tobacco: Never Used  Substance and Sexual Activity  . Alcohol use: Yes    Comment: once a month  . Drug use: No  . Sexual activity: Yes    Birth control/protection: None  Lifestyle  . Physical activity:    Days per week: Not on file    Minutes per session: Not on file  . Stress: Not on file  Relationships  . Social connections:    Talks on phone: Not on file    Gets together: Not on file    Attends religious service: Not on file    Active member of club or organization: Not on file    Attends meetings of clubs or organizations: Not on file    Relationship status: Not on file  Other Topics Concern  . Not on file  Social History Narrative  . Not  on file    Past Medical History:  Diagnosis Date  . Cancer [facility])    skin cancer     Patient Active Problem List   Diagnosis Date Noted  . Foot fracture, right, sequela 10/25/2017  . Hemorrhoid 05/17/2015  . 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 History of migraine headaches 05/17/2015  . HLD (hyperlipidemia) 05/17/2015  . Adult hypothyroidism 05/17/2015  . 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 Multinodular goiter 05/17/2015  . 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 Allergic rhinitis 11/08/2014  . Concussion  injury of body structure 11/08/2014  . Anxiety, generalized 11/08/2014  . Acid reflux 11/08/2014    Past Surgical History:  Procedure Laterality Date  . ABDOMINAL HYSTERECTOMY    . APPENDECTOMY    . BREAST SURGERY     left nipple biopsy  . MANDIBLE SURGERY    . SHOULDER SURGERY    . TUBAL LIGATION    . UPPER GI ENDOSCOPY  10/26/07   reflux esophagitis and a single gastric polyp    Family History        Family Status  Relation Name Status  . Mother  Alive  . Father  Alive  . Sister  Alive  . Brother  Alive  . Brother  Deceased  . Brother  Alive  . Daughter  Alive  . Son  Alive  . Daughter  Alive  . Mat Aunt  (Not Specified)  . MGM  (Not Specified)  . MGF  (Not Specified)  . PGF  (Not Specified)        Her family history includes Arthritis in her father and mother; Basal cell carcinoma in her mother; Breast cancer in her maternal aunt; Congenital heart disease in her brother; Congestive Heart Failure in her father; Diverticulosis in her father and mother; Fibroids in her mother and sister; Fibromyalgia in her sister; Berenice Primas' disease in her mother; Heart disease in her mother and sister; Hyperlipidemia in her brother; Hypertension in her brother, brother, mother, and sister; Hyperthyroidism in her father; Liver cancer in her paternal grandfather; Lung cancer in her father; Lupus in her sister; Skin cancer in her brother; Stroke in her brother, maternal grandfather, and maternal grandmother; Thyroid cancer in her mother; Ulcers in her father.      No Known Allergies   Current Outpatient Medications:  .  ALPRAZolam (XANAX) 0.25 MG tablet, 1 Every 8 hours as needed., Disp: 55 tablet, Rfl: 1 .  betamethasone dipropionate (DIPROLENE) 0.05 % ointment, Apply topically 2 (two) times daily., Disp: , Rfl:  .  Cholecalciferol (VITAMIN D) 2000 units CAPS, Take by mouth., Disp: , Rfl:  .  clidinium-chlordiazePOXIDE (LIBRAX) 5-2.5 MG capsule, TAKE 1 CAPSULE TWICE A DAY, Disp: 180 capsule,  Rfl: 3 .  DULoxetine (CYMBALTA) 60 MG capsule, TAKE ONE CAPSULE BY MOUTH EVERY DAY, Disp: 90 capsule, Rfl: 3 .  estradiol (VIVELLE-DOT) 0.1 MG/24HR patch, Place 1 patch onto the skin 2 (two) times daily., Disp: , Rfl: 12 .  FERREX 150 150 MG capsule, Take 150 mg by mouth daily., Disp: , Rfl: 0 .  fluticasone (FLONASE) 50 MCG/ACT nasal spray, USE 2 SPRAYS IN THE NOSE DAILY AS DIRECTED, Disp: 16 g, Rfl: 5 .  folic acid (FOLVITE) 1 MG tablet, TAKE 1 TABLET BY MOUTH EVERY DAY, Disp: 90 tablet, Rfl: 1 .  ibuprofen (ADVIL,MOTRIN) 800 MG tablet, TAKE 1 TABLET TWICE A DAY AS NEEDED, Disp: 60 tablet, Rfl: 12 .  metroNIDAZOLE (METROGEL) 0.75 % gel,  Apply 1 application topically 2 (two) times daily., Disp: , Rfl:  .  montelukast (SINGULAIR) 10 MG tablet, Take 1 tablet (10 mg total) by mouth at bedtime., Disp: 30 tablet, Rfl: 12 .  RABEprazole (ACIPHEX) 20 MG tablet, TAKE 1 TABLET BY MOUTH TWICE A DAY, Disp: 180 tablet, Rfl: 1 .  SYNTHROID 150 MCG tablet, Take 150 mcg by mouth daily., Disp: , Rfl: 5 .  topiramate (TOPAMAX) 50 MG tablet, TAKE 1 TABLET BY MOUTH EVERY DAY, Disp: 90 tablet, Rfl: 1 .  triamcinolone cream (KENALOG) 0.1 %, Apply 1 application topically 2 (two) times daily., Disp: , Rfl:  .  amoxicillin (AMOXIL) 875 MG tablet, Take 1 tablet (875 mg total) by mouth 2 (two) times daily. (Patient not taking: Reported on 11/15/2017), Disp: 20 tablet, Rfl: 0   Patient Care Team: Jerrol Banana., MD as PCP - General (Family Medicine)      Objective:   Vitals: BP 114/64 (BP Location: Left Arm, Patient Position: Sitting, Cuff Size: Normal)   Pulse 81   Temp 98.5 F (36.9 C) (Oral)   Wt 170 lb (77.1 kg)   SpO2 96%   BMI 25.10 kg/m    Vitals:   11/15/17 1038  BP: 114/64  Pulse: 81  Temp: 98.5 F (36.9 C)  TempSrc: Oral  SpO2: 96%  Weight: 170 lb (77.1 kg)     Physical Exam  Constitutional: She is oriented to person, place, and time. She appears well-developed and well-nourished.    HENT:  Head: Normocephalic and atraumatic.  Right Ear: External ear normal.  Left Ear: External ear normal.  Nose: Nose normal.  Mouth/Throat: Oropharynx is clear and moist.  Old left thyroid nodule.  Eyes: Conjunctivae are normal. No scleral icterus.  Neck: No thyromegaly present.  Cardiovascular: Normal rate, regular rhythm and normal heart sounds.  Pulmonary/Chest: Effort normal and breath sounds normal.  Abdominal: Soft.  Musculoskeletal: She exhibits no edema.  Lymphadenopathy:    She has no cervical adenopathy.  Neurological: She is alert and oriented to person, place, and time.  Skin: Skin is warm and dry.  Psychiatric: She has a normal mood and affect. Her behavior is normal. Judgment and thought content normal.     Depression Screen PHQ 2/9 Scores 11/03/2016  PHQ - 2 Score 0  PHQ- 9 Score 0      Assessment & Plan:     Routine Health Maintenance and Physical Exam  Exercise Activities and Dietary recommendations Goals   None     Immunization History  Administered Date(s) Administered  . Influenza, Seasonal, Injecte, Preservative Fre 11/26/2016  . Td 05/14/2017  . Tdap 05/05/2006    Health Maintenance  Topic Date Due  . HIV Screening  09/17/1976  . INFLUENZA VACCINE  09/23/2017  . COLONOSCOPY  10/25/2017  . MAMMOGRAM  12/08/2017  . PAP SMEAR  12/09/2018  . TETANUS/TDAP  05/15/2027  . Hepatitis C Screening  Completed     Discussed health benefits of physical activity, and encouraged her to engage in regular exercise appropriate for her age and condition.       2. Hyperlipidemia, unspecified hyperlipidemia type Stable will check labs.   - CBC with Differential/Platelet - Comprehensive metabolic panel - Lipid panel  3. Thyroid Disease Per Dr Forde Dandy  4. Seasonal allergic rhinitis due to pollen Worsening try OTC Zyrtec 10mg  Daily. Failed singulair.  5. Gastroesophageal reflux disease, esophagitis presence not specified Refills provided.  Of Aciphex. 6.Psoriasis 7.Rosacea   --------------------------------------------------------------------  I have done the exam and reviewed the above chart and it is accurate to the best of my knowledge. Development worker, community has been used in this note in any air is in the dictation or transcription are unintentional.  [name], MD  [place]

## 2017-11-16 LAB — CBC WITH DIFFERENTIAL/PLATELET
Basophils Absolute: 0.1 10*3/uL (ref 0.0–0.2)
Basos: 1 %
EOS (ABSOLUTE): 0.1 10*3/uL (ref 0.0–0.4)
Eos: 2 %
HEMOGLOBIN: 12.7 g/dL (ref 11.1–15.9)
Hematocrit: 38.9 % (ref 34.0–46.6)
Immature Grans (Abs): 0 10*3/uL (ref 0.0–0.1)
Immature Granulocytes: 0 %
LYMPHS ABS: 2.4 10*3/uL (ref 0.7–3.1)
Lymphs: 38 %
MCH: 28.1 pg (ref 26.6–33.0)
MCHC: 32.6 g/dL (ref 31.5–35.7)
MCV: 86 fL (ref 79–97)
MONOS ABS: 0.6 10*3/uL (ref 0.1–0.9)
Monocytes: 9 %
NEUTROS ABS: 3.1 10*3/uL (ref 1.4–7.0)
Neutrophils: 50 %
Platelets: 407 10*3/uL (ref 150–450)
RBC: 4.52 x10E6/uL (ref 3.77–5.28)
RDW: 13.8 % (ref 12.3–15.4)
WBC: 6.2 10*3/uL (ref 3.4–10.8)

## 2017-11-16 LAB — COMPREHENSIVE METABOLIC PANEL
ALBUMIN: 4.4 g/dL (ref 3.5–5.5)
ALK PHOS: 102 IU/L (ref 39–117)
ALT: 19 IU/L (ref 0–32)
AST: 16 IU/L (ref 0–40)
Albumin/Globulin Ratio: 1.6 (ref 1.2–2.2)
BILIRUBIN TOTAL: 0.2 mg/dL (ref 0.0–1.2)
BUN / CREAT RATIO: 15 (ref 9–23)
BUN: 17 mg/dL (ref 6–24)
CO2: 23 mmol/L (ref 20–29)
CREATININE: 1.11 mg/dL — AB (ref 0.57–1.00)
Calcium: 10 mg/dL (ref 8.7–10.2)
Chloride: 104 mmol/L (ref 96–106)
GFR calc Af Amer: 64 mL/min/{1.73_m2} (ref >59)
GFR calc non Af Amer: 56 mL/min/{1.73_m2} — ABNORMAL LOW (ref >59)
Globulin, Total: 2.7 g/dL (ref 1.5–4.5)
Glucose: 90 mg/dL (ref 65–99)
Potassium: 4.1 mmol/L (ref 3.5–5.2)
SODIUM: 139 mmol/L (ref 134–144)
Total Protein: 7.1 g/dL (ref 6.0–8.5)

## 2017-11-16 LAB — LIPID PANEL
CHOLESTEROL TOTAL: 228 mg/dL — AB (ref 100–199)
Chol/HDL Ratio: 3.3 ratio (ref 0.0–4.4)
HDL: 70 mg/dL (ref >39)
LDL CALC: 140 mg/dL — AB (ref 0–99)
Triglycerides: 92 mg/dL (ref 0–149)
VLDL CHOLESTEROL CAL: 18 mg/dL (ref 5–40)

## 2017-11-18 ENCOUNTER — Telehealth: Payer: Self-pay

## 2017-11-18 NOTE — Telephone Encounter (Signed)
-----   Message from Jerrol Banana., MD sent at 11/17/2017  1:45 PM EDT ----- Labs ok--would cut down on any NSAID with mild decline in kidney function.

## 2017-11-18 NOTE — Telephone Encounter (Signed)
Patient was advised and states that she had slight concern because she does not actively take NSAIDs, patient reports that she may take a Ibuprofen at least once a week but nothing else. She wants to know if she should completley cutt out Ibuprofen or could the decline of her kidney function be from something else? KW

## 2018-01-04 LAB — HM COLONOSCOPY

## 2018-02-01 ENCOUNTER — Telehealth: Payer: Self-pay

## 2018-02-01 DIAGNOSIS — N179 Acute kidney failure, unspecified: Secondary | ICD-10-CM

## 2018-02-01 NOTE — Telephone Encounter (Signed)
Renal panel for acute kidney injury

## 2018-02-01 NOTE — Telephone Encounter (Signed)
Done

## 2018-02-01 NOTE — Telephone Encounter (Signed)
Please advise 

## 2018-02-01 NOTE — Telephone Encounter (Signed)
Patient called the office today requesting lab slip, she states that Dr. Rosanna Russo wanted her to give the office a call back when she returned into town . Patient states that she will be in town for the next 2-3 days and would like for CMA to give her a call back when orders have been placed in chart. KW

## 2018-02-04 ENCOUNTER — Telehealth: Payer: Self-pay

## 2018-02-04 LAB — RENAL FUNCTION PANEL
Albumin: 4.1 g/dL (ref 3.5–5.5)
BUN / CREAT RATIO: 17 (ref 9–23)
BUN: 17 mg/dL (ref 6–24)
CALCIUM: 10 mg/dL (ref 8.7–10.2)
CHLORIDE: 103 mmol/L (ref 96–106)
CO2: 23 mmol/L (ref 20–29)
Creatinine, Ser: 1 mg/dL (ref 0.57–1.00)
GFR calc Af Amer: 73 mL/min/{1.73_m2} (ref >59)
GFR calc non Af Amer: 63 mL/min/{1.73_m2} (ref >59)
GLUCOSE: 88 mg/dL (ref 65–99)
POTASSIUM: 4.4 mmol/L (ref 3.5–5.2)
Phosphorus: 3.1 mg/dL (ref 2.5–4.5)
SODIUM: 140 mmol/L (ref 134–144)

## 2018-02-04 NOTE — Telephone Encounter (Signed)
Pt advised.   Thanks,   -Kanita Delage  

## 2018-02-04 NOTE — Telephone Encounter (Signed)
-----   Message from Jerrol Banana., MD sent at 02/04/2018  8:54 AM EST ----- Kidney function better.  I would probably continue to avoid nonsteroidals.

## 2018-04-18 ENCOUNTER — Other Ambulatory Visit: Payer: Self-pay | Admitting: Endocrinology

## 2018-04-18 DIAGNOSIS — E042 Nontoxic multinodular goiter: Secondary | ICD-10-CM

## 2018-04-22 ENCOUNTER — Other Ambulatory Visit: Payer: Self-pay | Admitting: Family Medicine

## 2018-04-29 ENCOUNTER — Ambulatory Visit
Admission: RE | Admit: 2018-04-29 | Discharge: 2018-04-29 | Disposition: A | Discharge status: Home / Self Care | Payer: BLUE CROSS/BLUE SHIELD | Source: Ambulatory Visit | Attending: Endocrinology | Admitting: Endocrinology

## 2018-04-29 DIAGNOSIS — E042 Nontoxic multinodular goiter: Secondary | ICD-10-CM

## 2018-07-14 ENCOUNTER — Other Ambulatory Visit: Payer: Self-pay | Admitting: Family Medicine

## 2018-08-17 ENCOUNTER — Other Ambulatory Visit: Payer: Self-pay | Admitting: Family Medicine

## 2018-10-03 ENCOUNTER — Other Ambulatory Visit: Payer: Self-pay | Admitting: Family Medicine

## 2018-11-30 ENCOUNTER — Encounter: Payer: BLUE CROSS/BLUE SHIELD | Admitting: Family Medicine

## 2018-12-05 ENCOUNTER — Encounter: Payer: Self-pay | Admitting: Family Medicine

## 2018-12-05 ENCOUNTER — Ambulatory Visit (INDEPENDENT_AMBULATORY_CARE_PROVIDER_SITE_OTHER): Payer: BC Managed Care – PPO | Admitting: Family Medicine

## 2018-12-05 ENCOUNTER — Other Ambulatory Visit: Payer: Self-pay

## 2018-12-05 VITALS — BP 112/68 | HR 76 | Temp 98.4°F | Resp 16 | Ht 69.0 in | Wt 179.0 lb

## 2018-12-05 DIAGNOSIS — N179 Acute kidney failure, unspecified: Secondary | ICD-10-CM

## 2018-12-05 DIAGNOSIS — E039 Hypothyroidism, unspecified: Secondary | ICD-10-CM

## 2018-12-05 DIAGNOSIS — Z0000 Encounter for general adult medical examination without abnormal findings: Secondary | ICD-10-CM | POA: Diagnosis not present

## 2018-12-05 DIAGNOSIS — E785 Hyperlipidemia, unspecified: Secondary | ICD-10-CM

## 2018-12-05 DIAGNOSIS — D649 Anemia, unspecified: Secondary | ICD-10-CM | POA: Diagnosis not present

## 2018-12-05 DIAGNOSIS — Z23 Encounter for immunization: Secondary | ICD-10-CM | POA: Diagnosis not present

## 2018-12-05 LAB — POCT URINALYSIS DIPSTICK
Bilirubin, UA: NEGATIVE
Blood, UA: NEGATIVE
Glucose, UA: NEGATIVE
Ketones, UA: NEGATIVE
Leukocytes, UA: NEGATIVE
Nitrite, UA: NEGATIVE
Protein, UA: NEGATIVE
Spec Grav, UA: 1.025 (ref 1.010–1.025)
Urobilinogen, UA: 0.2 E.U./dL
pH, UA: 6 (ref 5.0–8.0)

## 2018-12-05 NOTE — Progress Notes (Signed)
Patient: Crystal Russo, Female    DOB: 01/30/1961, 58 y.o.   MRN: [id number] Visit Date: 12/05/2018  Today's Provider: [name], MD   Chief Complaint  Patient presents with  . Annual Exam   Subjective:     Annual physical exam Crystal Russo is a 58 y.o. female who presents today for health maintenance and complete physical. She feels well. She reports exercising not regularly, but she does stay active. She reports she is sleeping well. She sees Dr. Dory Horn from gynecology and has for the past 38 years.  He delivered all 3 of her children. She sees Dr. Forde Dandy  from endocrinology.  He told her her thyroid ultrasound is improving.  She does see dermatology for psoriasis and rosacea of her eyes.  Dr. Nehemiah Massed. Colonoscopy- 01/04/2018. Dr. Vira Agar  Pap- 12/09/2015. Normal. Has GYN     Review of Systems  Constitutional: Negative.  Negative for chills and diaphoresis.  HENT: Positive for congestion, postnasal drip and sinus pressure. Negative for dental problem, drooling, ear discharge, ear pain, facial swelling, hearing loss, mouth sores, nosebleeds, rhinorrhea, sinus pain, sneezing, sore throat, tinnitus, trouble swallowing and voice change.   Eyes: Negative.   Respiratory: Negative.  Negative for cough and shortness of breath.   Cardiovascular: Negative.   Gastrointestinal: Negative.   Endocrine: Negative.   Genitourinary: Negative.   Musculoskeletal: Positive for arthralgias. Negative for neck pain.       Her hands are stiff in the morning without any swelling and she has some left knee stiffness.  Skin: Negative.   Allergic/Immunologic: Negative.   Neurological: Positive for headaches. Negative for dizziness, tremors, seizures, syncope, facial asymmetry, speech difficulty, weakness, light-headedness and numbness.  Hematological: Negative.   Psychiatric/Behavioral: Negative.     Social History      She  reports that she has never smoked. She has never used  smokeless tobacco. She reports current alcohol use. She reports that she does not use drugs.       Social History   Socioeconomic History  . Marital status: Married    Spouse name: Not on file  . Number of children: Not on file  . Years of education: Not on file  . Highest education level: Not on file  Occupational History  . Not on file  Social Needs  . Financial resource strain: Not on file  . Food insecurity    Worry: Not on file    Inability: Not on file  . Transportation needs    Medical: Not on file    Non-medical: Not on file  Tobacco Use  . Smoking status: Never Smoker  . Smokeless tobacco: Never Used  Substance and Sexual Activity  . Alcohol use: Yes    Comment: once a month  . Drug use: No  . Sexual activity: Yes    Birth control/protection: None  Lifestyle  . Physical activity    Days per week: Not on file    Minutes per session: Not on file  . Stress: Not on file  Relationships  . Social Herbalist on phone: Not on file    Gets together: Not on file    Attends religious service: Not on file    Active member of club or organization: Not on file    Attends meetings of clubs or organizations: Not on file    Relationship status: Not on file  Other Topics Concern  . Not on file  Social History Narrative  . Not on file    Past Medical History:  Diagnosis Date  . Cancer [facility])    skin cancer     Patient Active Problem List   Diagnosis Date Noted  . Foot fracture, right, sequela 10/25/2017  . Hemorrhoid 05/17/2015  . 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 History of migraine headaches 05/17/2015  . HLD (hyperlipidemia) 05/17/2015  . Adult hypothyroidism 05/17/2015  . 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 Multinodular goiter 05/17/2015  . 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 Allergic rhinitis 11/08/2014  . Concussion injury of body structure 11/08/2014  . Anxiety, generalized 11/08/2014  . Acid reflux  11/08/2014    Past Surgical History:  Procedure Laterality Date  . ABDOMINAL HYSTERECTOMY    . APPENDECTOMY    . BREAST SURGERY     left nipple biopsy  . MANDIBLE SURGERY    . SHOULDER SURGERY    . TUBAL LIGATION    . UPPER GI ENDOSCOPY  10/26/07   reflux esophagitis and a single gastric polyp    Family History        Family Status  Relation Name Status  . Mother  Alive  . Father  Alive  . Sister  Alive  . Brother  Alive  . Brother  Deceased  . Brother  Alive  . Daughter  Alive  . Son  Alive  . Daughter  Alive  . Mat Aunt  (Not Specified)  . MGM  (Not Specified)  . MGF  (Not Specified)  . PGF  (Not Specified)        Her family history includes Arthritis in her father and mother; Basal cell carcinoma in her mother; Breast cancer in her maternal aunt; Congenital heart disease in her brother; Congestive Heart Failure in her father; Diverticulosis in her father and mother; Fibroids in her mother and sister; Fibromyalgia in her sister; Berenice Primas' disease in her mother; Heart disease in her mother and sister; Hyperlipidemia in her brother; Hypertension in her brother, brother, mother, and sister; Hyperthyroidism in her father; Liver cancer in her paternal grandfather; Lung cancer in her father; Lupus in her sister; Skin cancer in her brother; Stroke in her brother, maternal grandfather, and maternal grandmother; Thyroid cancer in her mother; Ulcers in her father.      No Known Allergies   Current Outpatient Medications:  .  ALPRAZolam (XANAX) 0.25 MG tablet, 1 Every 8 hours as needed., Disp: 55 tablet, Rfl: 1 .  betamethasone dipropionate (DIPROLENE) 0.05 % ointment, Apply topically 2 (two) times daily., Disp: , Rfl:  .  Cholecalciferol (VITAMIN D) 2000 units CAPS, Take by mouth., Disp: , Rfl:  .  clidinium-chlordiazePOXIDE (LIBRAX) 5-2.5 MG capsule, TAKE 1 CAPSULE TWICE A DAY, Disp: 180 capsule, Rfl: 3 .  DULoxetine (CYMBALTA) 60 MG capsule, TAKE ONE CAPSULE BY MOUTH EVERY DAY,  Disp: 90 capsule, Rfl: 3 .  estradiol (VIVELLE-DOT) 0.1 MG/24HR patch, Place 1 patch onto the skin 2 (two) times daily., Disp: , Rfl: 12 .  FERREX 150 150 MG capsule, Take 150 mg by mouth daily., Disp: , Rfl: 0 .  fluticasone (FLONASE) 50 MCG/ACT nasal spray, USE 2 SPRAYS IN THE NOSE DAILY AS DIRECTED, Disp: 16 g, Rfl: 5 .  folic acid (FOLVITE) 1 MG tablet, TAKE 1 TABLET BY MOUTH EVERY DAY, Disp: 90 tablet, Rfl: 1 .  metroNIDAZOLE (METROGEL) 0.75 % gel, Apply 1 application topically 2 (two) times daily., Disp: , Rfl:  .  montelukast (SINGULAIR) 10 MG tablet, Take 1 tablet (10 mg total) by mouth at bedtime., Disp: 30 tablet, Rfl: 12 .  RABEprazole (ACIPHEX) 20 MG tablet, TAKE 1 TABLET BY MOUTH TWICE A DAY, Disp: 180 tablet, Rfl: 1 .  SYNTHROID 150 MCG tablet, Take 150 mcg by mouth daily., Disp: , Rfl: 5 .  topiramate (TOPAMAX) 50 MG tablet, TAKE 1 TABLET BY MOUTH EVERY DAY, Disp: 90 tablet, Rfl: 1 .  triamcinolone cream (KENALOG) 0.1 %, Apply 1 application topically 2 (two) times daily., Disp: , Rfl:  .  amoxicillin (AMOXIL) 875 MG tablet, Take 1 tablet (875 mg total) by mouth 2 (two) times daily. (Patient not taking: Reported on 11/15/2017), Disp: 20 tablet, Rfl: 0 .  ibuprofen (ADVIL,MOTRIN) 800 MG tablet, TAKE 1 TABLET TWICE A DAY AS NEEDED (Patient not taking: Reported on 12/05/2018), Disp: 60 tablet, Rfl: 12   Patient Care Team: Jerrol Banana., MD as PCP - General (Family Medicine)    Objective:    Vitals: BP 112/68   Pulse 76   Temp 98.4 F (36.9 C)   Resp 16   Ht 5\' 9"  (1.753 m)   Wt 179 lb (81.2 kg)   SpO2 96%   BMI 26.43 kg/m    Vitals:   12/05/18 1524  BP: 112/68  Pulse: 76  Resp: 16  Temp: 98.4 F (36.9 C)  SpO2: 96%  Weight: 179 lb (81.2 kg)  Height: 5\' 9"  (1.753 m)     Physical Exam   Depression Screen PHQ 2/9 Scores 12/05/2018 11/17/2017 11/03/2016  PHQ - 2 Score 0 0 0  PHQ- 9 Score - 0 0       Assessment & Plan:     Routine Health  Maintenance and Physical Exam  Exercise Activities and Dietary recommendations Goals   None     Immunization History  Administered Date(s) Administered  . Influenza, Seasonal, Injecte, Preservative Fre 11/26/2016  . Td 05/14/2017  . Tdap 05/05/2006    Health Maintenance  Topic Date Due  . HIV Screening  09/17/1976  . COLONOSCOPY  10/25/2017  . MAMMOGRAM  12/08/2017  . INFLUENZA VACCINE  09/24/2018  . PAP SMEAR-Modifier  12/09/2018  . TETANUS/TDAP  05/15/2027  . Hepatitis C Screening  Completed     Discussed health benefits of physical activity, and encouraged her to engage in regular exercise appropriate for her age and condition.  1. Annual physical exam GYN per Dr.  Nori Riis - Comprehensive metabolic panel - Lipid panel - POCT urinalysis dipstick  2. Anemia, unspecified type  - CBC with Differential/Platelet - Iron, TIBC and Ferritin Panel  3. Adult hypothyroidism Per Dr. Forde Dandy - TSH + free T4  4. Flu vaccine need  - Flu Vaccine QUAD 6+ mos PF IM (Fluarix Quad PF)  5. Hyperlipidemia, unspecified hyperlipidemia type   6. Acute kidney injury ([place])  7.  GERD Patient had E GD with her esophagus being stretched.  She also had a recent colonoscopy during this time  [name], MD  [place]

## 2018-12-08 LAB — COMPREHENSIVE METABOLIC PANEL
ALT: 17 IU/L (ref 0–32)
AST: 17 IU/L (ref 0–40)
Albumin/Globulin Ratio: 1.8 (ref 1.2–2.2)
Albumin: 4.2 g/dL (ref 3.8–4.9)
Alkaline Phosphatase: 90 IU/L (ref 39–117)
BUN/Creatinine Ratio: 20 (ref 9–23)
BUN: 17 mg/dL (ref 6–24)
Bilirubin Total: 0.3 mg/dL (ref 0.0–1.2)
CO2: 22 mmol/L (ref 20–29)
Calcium: 9.8 mg/dL (ref 8.7–10.2)
Chloride: 104 mmol/L (ref 96–106)
Creatinine, Ser: 0.85 mg/dL (ref 0.57–1.00)
GFR calc Af Amer: 88 mL/min/{1.73_m2} (ref >59)
GFR calc non Af Amer: 76 mL/min/{1.73_m2} (ref >59)
Globulin, Total: 2.3 g/dL (ref 1.5–4.5)
Glucose: 93 mg/dL (ref 65–99)
Potassium: 4.2 mmol/L (ref 3.5–5.2)
Sodium: 136 mmol/L (ref 134–144)
Total Protein: 6.5 g/dL (ref 6.0–8.5)

## 2018-12-08 LAB — CBC WITH DIFFERENTIAL/PLATELET
Basophils Absolute: 0 10*3/uL (ref 0.0–0.2)
Basos: 1 %
EOS (ABSOLUTE): 0.1 10*3/uL (ref 0.0–0.4)
Eos: 3 %
Hematocrit: 38.5 % (ref 34.0–46.6)
Hemoglobin: 12.9 g/dL (ref 11.1–15.9)
Immature Grans (Abs): 0 10*3/uL (ref 0.0–0.1)
Immature Granulocytes: 0 %
Lymphocytes Absolute: 1.7 10*3/uL (ref 0.7–3.1)
Lymphs: 40 %
MCH: 29.7 pg (ref 26.6–33.0)
MCHC: 33.5 g/dL (ref 31.5–35.7)
MCV: 89 fL (ref 79–97)
Monocytes Absolute: 0.5 10*3/uL (ref 0.1–0.9)
Monocytes: 11 %
Neutrophils Absolute: 1.9 10*3/uL (ref 1.4–7.0)
Neutrophils: 45 %
Platelets: 345 10*3/uL (ref 150–450)
RBC: 4.34 x10E6/uL (ref 3.77–5.28)
RDW: 12.5 % (ref 11.7–15.4)
WBC: 4.2 10*3/uL (ref 3.4–10.8)

## 2018-12-08 LAB — IRON,TIBC AND FERRITIN PANEL
Ferritin: 34 ng/mL (ref 15–150)
Iron Saturation: 25 % (ref 15–55)
Iron: 70 ug/dL (ref 27–159)
Total Iron Binding Capacity: 276 ug/dL (ref 250–450)
UIBC: 206 ug/dL (ref 131–425)

## 2018-12-08 LAB — LIPID PANEL
Chol/HDL Ratio: 3.3 ratio (ref 0.0–4.4)
Cholesterol, Total: 206 mg/dL — ABNORMAL HIGH (ref 100–199)
HDL: 63 mg/dL (ref >39)
LDL Chol Calc (NIH): 131 mg/dL — ABNORMAL HIGH (ref 0–99)
Triglycerides: 68 mg/dL (ref 0–149)
VLDL Cholesterol Cal: 12 mg/dL (ref 5–40)

## 2018-12-08 LAB — TSH+FREE T4
Free T4: 1.48 ng/dL (ref 0.82–1.77)
TSH: 0.132 u[IU]/mL — ABNORMAL LOW (ref 0.450–4.500)

## 2018-12-16 ENCOUNTER — Other Ambulatory Visit: Payer: Self-pay | Admitting: Obstetrics & Gynecology

## 2018-12-16 ENCOUNTER — Other Ambulatory Visit: Payer: Self-pay | Admitting: Internal Medicine

## 2018-12-16 DIAGNOSIS — Z803 Family history of malignant neoplasm of breast: Secondary | ICD-10-CM

## 2019-01-10 ENCOUNTER — Ambulatory Visit
Admission: RE | Admit: 2019-01-10 | Discharge: 2019-01-10 | Disposition: A | Discharge status: Home / Self Care | Payer: BC Managed Care – PPO | Source: Ambulatory Visit | Attending: Obstetrics & Gynecology | Admitting: Obstetrics & Gynecology

## 2019-01-10 ENCOUNTER — Other Ambulatory Visit: Payer: Self-pay

## 2019-01-10 DIAGNOSIS — Z803 Family history of malignant neoplasm of breast: Secondary | ICD-10-CM

## 2019-01-10 MED ORDER — GADOBUTROL 1 MMOL/ML IV SOLN
8.0000 mL | Freq: Once | INTRAVENOUS | Status: AC | PRN
  Administered 2019-01-10: 8 mL via INTRAVENOUS

## 2019-01-25 ENCOUNTER — Other Ambulatory Visit: Payer: Self-pay | Admitting: Family Medicine

## 2019-04-03 ENCOUNTER — Other Ambulatory Visit: Payer: Self-pay | Admitting: Family Medicine

## 2019-04-03 DIAGNOSIS — K219 Gastro-esophageal reflux disease without esophagitis: Secondary | ICD-10-CM

## 2019-04-03 DIAGNOSIS — F320 Major depressive disorder, single episode, mild: Secondary | ICD-10-CM

## 2019-04-03 DIAGNOSIS — E039 Hypothyroidism, unspecified: Secondary | ICD-10-CM

## 2019-04-03 DIAGNOSIS — Z8669 Personal history of other diseases of the nervous system and sense organs: Secondary | ICD-10-CM

## 2019-04-03 DIAGNOSIS — K589 Irritable bowel syndrome without diarrhea: Secondary | ICD-10-CM

## 2019-04-03 DIAGNOSIS — D649 Anemia, unspecified: Secondary | ICD-10-CM

## 2019-04-03 MED ORDER — TOPIRAMATE 50 MG PO TABS: 50.0000 mg | ORAL_TABLET | Freq: Every day | ORAL | 1 refills | Status: DC

## 2019-04-03 MED ORDER — RABEPRAZOLE SODIUM 20 MG PO TBEC: 20.0000 mg | DELAYED_RELEASE_TABLET | Freq: Two times a day (BID) | ORAL | 1 refills | Status: DC

## 2019-04-03 MED ORDER — FOLIC ACID 1 MG PO TABS: 1.0000 mg | ORAL_TABLET | Freq: Every day | ORAL | 1 refills | Status: DC

## 2019-04-03 MED ORDER — DULOXETINE HCL 60 MG PO CPEP: 60.0000 mg | ORAL_CAPSULE | Freq: Every day | ORAL | 3 refills | Status: DC

## 2019-04-03 MED ORDER — SYNTHROID 150 MCG PO TABS: 150.0000 ug | ORAL_TABLET | Freq: Every day | ORAL | 3 refills | Status: DC

## 2019-04-03 MED ORDER — CILIDINIUM-CHLORDIAZEPOXIDE 2.5-5 MG PO CAPS: 1.0000 | ORAL_CAPSULE | Freq: Two times a day (BID) | ORAL | 3 refills | Status: DC

## 2019-04-03 NOTE — Telephone Encounter (Signed)
[facility] faxed refill request for the following medications:  RABEprazole (ACIPHEX) 20 MG tablet  clidinium-chlordiazePOXIDE (LIBRAX) 5-2.5 MG capsule  DULoxetine (CYMBALTA) 60 MG capsule  topiramate (TOPAMAX) 50 MG tablet   folic acid (FOLVITE) 1 MG tablet  SYNTHROID 150 MCG tablet  90 day supply  Please advise.

## 2019-04-04 ENCOUNTER — Telehealth: Payer: Self-pay | Admitting: Family Medicine

## 2019-04-04 DIAGNOSIS — E039 Hypothyroidism, unspecified: Secondary | ICD-10-CM

## 2019-04-04 MED ORDER — LEVOTHYROXINE SODIUM 150 MCG PO TABS: 150.0000 ug | ORAL_TABLET | Freq: Every day | ORAL | 3 refills | Status: DC

## 2019-04-04 NOTE — Telephone Encounter (Signed)
Resent rx for generic form.

## 2019-04-04 NOTE — Telephone Encounter (Signed)
Copied from [name] [id number]. Topic: General - Other >> Apr 04, 2019 11:11 AM Keene Breath wrote: Reason for CRM: Called to ask the doctor to resend a script for the patient's medication, SYNTHROID 150 MCG tablet.   Stated that they use a generic for this medication which cost much less for the patient.  Please replay asap so the patient can save the cost.  Stated that it is time sensitive.  CB# [id number], Ref# [id number].

## 2019-04-05 ENCOUNTER — Other Ambulatory Visit: Payer: Self-pay | Admitting: Family Medicine

## 2019-04-05 DIAGNOSIS — E039 Hypothyroidism, unspecified: Secondary | ICD-10-CM

## 2019-04-05 DIAGNOSIS — K589 Irritable bowel syndrome without diarrhea: Secondary | ICD-10-CM

## 2019-04-05 MED ORDER — CILIDINIUM-CHLORDIAZEPOXIDE 2.5-5 MG PO CAPS: 1.0000 | ORAL_CAPSULE | Freq: Two times a day (BID) | ORAL | 3 refills | Status: DC

## 2019-04-05 NOTE — Telephone Encounter (Signed)
[facility] calling back to ask about the  levothyroxine (SYNTHROID) 150 MCG tablet  They can dispense brand name synthroid  for a lower copay, if the "dispense as written" is removed from the instructions.  cb  [id number] Ref no  [id number]

## 2019-04-05 NOTE — Telephone Encounter (Signed)
[facility] faxed refill request for the following medications:  clidinium-chlordiazePOXIDE (LIBRAX) 5-2.5 MG capsule 90 day supply Rx was ordered 04/03/2019 but it was set to print. Can Rx be sent via E-Scribe? Please advise. Thanks TNP

## 2019-04-05 NOTE — Telephone Encounter (Signed)
Please advise? Tried to correct where prescription was set for print but it still printed. Can you make it an e-script?

## 2019-04-07 MED ORDER — LEVOTHYROXINE SODIUM 150 MCG PO TABS: 150.0000 ug | ORAL_TABLET | Freq: Every day | ORAL | 3 refills | Status: DC

## 2019-04-07 NOTE — Telephone Encounter (Signed)
Ok to refill as requested--thx

## 2019-04-07 NOTE — Telephone Encounter (Signed)
Resent rx

## 2019-04-11 ENCOUNTER — Other Ambulatory Visit: Payer: Self-pay

## 2019-04-11 ENCOUNTER — Other Ambulatory Visit: Payer: Self-pay | Admitting: Family Medicine

## 2019-04-11 ENCOUNTER — Telehealth: Payer: Self-pay | Admitting: Family Medicine

## 2019-04-11 DIAGNOSIS — K589 Irritable bowel syndrome without diarrhea: Secondary | ICD-10-CM

## 2019-04-11 MED ORDER — CILIDINIUM-CHLORDIAZEPOXIDE 2.5-5 MG PO CAPS: 1.0000 | ORAL_CAPSULE | Freq: Two times a day (BID) | ORAL | 3 refills | Status: DC

## 2019-04-11 MED ORDER — CILIDINIUM-CHLORDIAZEPOXIDE 2.5-5 MG PO CAPS: 1.0000 | ORAL_CAPSULE | Freq: Two times a day (BID) | ORAL | 3 refills | Status: DC | PRN

## 2019-04-11 NOTE — Telephone Encounter (Signed)
[facility] faxed refill request for the following medications: Fax:  [phone]  CHLORDIAZEP/CLIDIN ([place]) CP 5/2.5  Please advise.  Thanks, [employer]

## 2019-04-11 NOTE — Addendum Note (Signed)
Addended by: Shawna Orleans on: 04/11/2019 11:44 AM   Modules accepted: Orders

## 2019-04-12 ENCOUNTER — Telehealth: Payer: Self-pay

## 2019-04-12 NOTE — Telephone Encounter (Signed)
Copied from [place] [id number]. Topic: General - Other >> Apr 12, 2019  1:00 PM Leward Quan A wrote: Reason for CRM: [place] called to request some clarification on the Rx levothyroxine (SYNTHROID) 150 MCG tablet asking for a call back at Ph#  [phone] ref# [id number]

## 2019-04-12 NOTE — Telephone Encounter (Signed)
Called and spoke with pharmacist from [place] and clarified prescription for Levothyroxine. KW

## 2019-06-28 ENCOUNTER — Ambulatory Visit: Payer: BC Managed Care – PPO | Admitting: Dermatology

## 2019-07-26 ENCOUNTER — Ambulatory Visit: Payer: BC Managed Care – PPO | Admitting: Dermatology

## 2019-08-16 ENCOUNTER — Other Ambulatory Visit: Payer: Self-pay

## 2019-08-16 ENCOUNTER — Ambulatory Visit: Payer: BC Managed Care – PPO | Admitting: Dermatology

## 2019-08-16 DIAGNOSIS — L409 Psoriasis, unspecified: Secondary | ICD-10-CM | POA: Diagnosis not present

## 2019-08-16 MED ORDER — OTEZLA 30 MG PO TABS: 30.0000 mg | ORAL_TABLET | Freq: Two times a day (BID) | ORAL | 5 refills | Status: DC

## 2019-08-16 MED ORDER — OTEZLA 30 MG PO TABS: 30.0000 mg | ORAL_TABLET | Freq: Two times a day (BID) | ORAL | 12 refills | Status: DC

## 2019-08-16 NOTE — Progress Notes (Signed)
° °  Follow-Up Visit   Subjective  Crystal Russo is a 59 y.o. female who presents for the following: Psoriasis.  Patient here today for psoriasis follow up at the left foot and toes at both feet. She is using Sorilux foam and betamethasone ointment every other weekend, daily she is using only Aquaphor. Psoriasis flares after walking a lot.  Gets very itchy. Patient has seen rheumatologist and she does have arthritis but not psoriatic arthritis. She continues to have joint pain, but is unable to take NSAIDs due to kidney side effects.  She treats it with topical rubs.  The following portions of the chart were reviewed this encounter and updated as appropriate:      Review of Systems:  No other skin or systemic complaints except as noted in HPI or Assessment and Plan.  Objective  Well appearing patient in no apparent distress; mood and affect are within normal limits.  A focused examination was performed including feet, toes. Relevant physical exam findings are noted in the Assessment and Plan.  Objective  Left plantar Foot: Erythematous scaly patch with some deep microvesicles BSA 2%   Assessment & Plan  Psoriasis Left plantar Foot  Minimal improvement with topical treatment Continue calcipotriene, increasing to daily Continue betamethasone increasing to every weekend Start Otezla 30mg  PO bid, samples packs x 2 given to patient for slow taper up.  Lot #[id number]   Exp: May 2021  Side effects of Otezla (apremilast) include diarrhea, nausea, headache, upper respiratory infection, depression, and weight decrease (5-10%). It should only be taken by pregnant women after a discussion regarding risks and benefits with their doctor.    Topical steroids (such as triamcinolone, fluocinolone, fluocinonide, mometasone, clobetasol, halobetasol, betamethasone, hydrocortisone) can cause thinning and lightening of the skin if they are used for too long in the same area. Your physician has selected  the right strength medicine for your problem and area affected on the body. Please use your medication only as directed by your physician to prevent side effects.       Ordered Medications: Apremilast (OTEZLA) 30 MG TABS Apremilast (OTEZLA) 30 MG TABS  Return in about 2 months (around 10/16/2019) for psoriasis with Dr. Laurence Ferrari.  Graciella Belton, RMA, am acting as scribe for Brendolyn Patty, MD .  Documentation: I have reviewed the above documentation for accuracy and completeness, and I agree with the above.  Brendolyn Patty MD

## 2019-08-16 NOTE — Patient Instructions (Addendum)
Recommend daily broad spectrum sunscreen SPF 30+ to sun-exposed areas, reapply every 2 hours as needed. Call for new or changing lesions.  Continue calcipotriene increasing to daily as needed Continue betamethasone increasing to every weekend as needed  Topical steroids (such as triamcinolone, fluocinolone, fluocinonide, mometasone, clobetasol, halobetasol, betamethasone, hydrocortisone) can cause thinning and lightening of the skin if they are used for too long in the same area. Your physician has selected the right strength medicine for your problem and area affected on the body. Please use your medication only as directed by your physician to prevent side effects.   Side effects of Otezla (apremilast) include diarrhea, nausea, headache, upper respiratory infection, depression, and weight decrease (5-10%). It should only be taken by pregnant women after a discussion regarding risks and benefits with their doctor.

## 2019-09-07 ENCOUNTER — Other Ambulatory Visit: Payer: Self-pay | Admitting: Family Medicine

## 2019-09-07 DIAGNOSIS — Z8669 Personal history of other diseases of the nervous system and sense organs: Secondary | ICD-10-CM

## 2019-09-07 DIAGNOSIS — D649 Anemia, unspecified: Secondary | ICD-10-CM

## 2019-09-07 DIAGNOSIS — K219 Gastro-esophageal reflux disease without esophagitis: Secondary | ICD-10-CM

## 2019-10-31 ENCOUNTER — Telehealth: Payer: Self-pay

## 2019-10-31 NOTE — H&P (View-Only) (Signed)
I,[name],acting as a scribe for Crystal Durie, MD.,have documented all relevant documentation on the behalf of Crystal Durie, MD,as directed by  Crystal Durie, MD while in the presence of Crystal Durie, MD.   Established patient visit   Patient: Crystal Russo   DOB: 06-Jul-1960   59 y.o. Female  MRN: [id number] Visit Date: 11/01/2019  Today's healthcare provider: [name], MD   Chief Complaint  Patient presents with  . Hospitalization Follow-up   Subjective    HPI  8 days ago the patient drove 6 hours in the car in 1 day.  A week ago she developed left leg pain and on 2 September she was diagnosed with a DVT and placed on Eliquis.  She comes in today for follow-up.  Her entire leg is swollen on the left and she has significant  pain.  She is only taken a hydrocodone at night but that is the only way she is able to get any sleep that she is in pain all the time.  She is now on Eliquis starter pack.  She did have a chest CT which was negative for PE and her O2 sat is 98% here in the office today. She also complains of some discomfort in the left CVA area without any urinary symptoms. Follow up ER visit  Patient was seen in ER for Fall on 10/26/2019. She was treated for; left leg pain, swelling. Blood clots in left leg. Treatment for this included; [place] of left leg. Given rx's for Eliquis 5 mg qd and Hydrocodone/Ace 5/325 mg prn. Advised to follow up with her pcp. She reports good compliance with treatment. She reports this condition is Unchanged.  -------------------------------------------------------------------  Patient still has pain and swelling in her left leg.  She is concerned about a family history of clots.  Her mother had multiple small CVAs.  Maternal aunt had DVT after breast cancer.  Father had DVTs after surgery.  Medications: Outpatient Medications Prior to Visit  Medication Sig  . Apremilast (OTEZLA) 30 MG TABS Take 1 tablet (30  mg total) by mouth 2 (two) times daily.  Marland Kitchen Apremilast (OTEZLA) 30 MG TABS Take 1 tablet (30 mg total) by mouth 2 (two) times daily.  . Cholecalciferol (VITAMIN D) 2000 units CAPS Take by mouth.  . clidinium-chlordiazePOXIDE (LIBRAX) 5-2.5 MG capsule Take 1 capsule by mouth 2 (two) times daily as needed.  . DULoxetine (CYMBALTA) 60 MG capsule Take 1 capsule (60 mg total) by mouth daily.  Marland Kitchen ELIQUIS DVT/PE STARTER PACK Take by mouth as directed.  Marland Kitchen estradiol (VIVELLE-DOT) 0.1 MG/24HR patch Place 1 patch onto the skin 2 (two) times daily.  . fluticasone (FLONASE) 50 MCG/ACT nasal spray USE 2 SPRAYS IN THE NOSE DAILY AS DIRECTED  . folic acid (FOLVITE) 1 MG tablet TAKE 1 TABLET DAILY  . HYDROcodone-acetaminophen (NORCO/VICODIN) 5-325 MG tablet Take 1 tablet by mouth every 6 (six) hours as needed.  Marland Kitchen levothyroxine (SYNTHROID) 150 MCG tablet Take 1 tablet (150 mcg total) by mouth daily before breakfast.  . metroNIDAZOLE (METROGEL) 0.75 % gel Apply 1 application topically 2 (two) times daily.  . RABEprazole (ACIPHEX) 20 MG tablet TAKE 1 TABLET TWICE A DAY  . topiramate (TOPAMAX) 50 MG tablet TAKE 1 TABLET DAILY  . ALPRAZolam (XANAX) 0.25 MG tablet 1 Every 8 hours as needed. (Patient not taking: Reported on 11/01/2019)  . amoxicillin (AMOXIL) 875 MG tablet Take 1 tablet (875 mg total) by mouth 2 (  two) times daily. (Patient not taking: Reported on 11/15/2017)  . betamethasone dipropionate (DIPROLENE) 0.05 % ointment Apply topically 2 (two) times daily. (Patient not taking: Reported on 11/01/2019)  . FERREX 150 150 MG capsule Take 150 mg by mouth daily. (Patient not taking: Reported on 11/01/2019)  . ibuprofen (ADVIL,MOTRIN) 800 MG tablet TAKE 1 TABLET TWICE A DAY AS NEEDED (Patient not taking: Reported on 12/05/2018)  . montelukast (SINGULAIR) 10 MG tablet Take 1 tablet (10 mg total) by mouth at bedtime. (Patient not taking: Reported on 11/01/2019)  . triamcinolone cream (KENALOG) 0.1 % Apply 1 application  topically 2 (two) times daily. (Patient not taking: Reported on 11/01/2019)   No facility-administered medications prior to visit.    Review of Systems  Constitutional: Negative for appetite change, chills, fatigue and fever.  Respiratory: Negative for chest tightness and shortness of breath.   Cardiovascular: Negative for chest pain and palpitations.  Gastrointestinal: Negative for abdominal pain, nausea and vomiting.  Neurological: Negative for dizziness and weakness.       Objective    BP 114/78 (BP Location: Left Arm, Patient Position: Sitting, Cuff Size: Large)   Pulse 89   Temp 98.3 F (36.8 C) (Oral)   Resp 16   Ht 5\' 9"  (1.753 m)   Wt 187 lb 6.4 oz (85 kg)   SpO2 98%   BMI 27.67 kg/m     Physical Exam    No results found for any visits on 11/01/19.  Assessment & Plan     1. DVT of deep femoral vein, left (HCC) DVT of entire left leg.  It is significantly swollen compared to the right.  Pulses are good and color is good as is capillary refill.  She is 6 days into documented DVT, on Eliquis.  Refer to vascular surgery for consideration of thrombectomy. Due to significant pain we will give her hydrocodone 5/325 to take every 4 hours as needed to get some sleep.  #25. 2. Coagulopathy ([place]) Patient concerned because of some family history of this.  She and her husband have 3 children.  Refer to hematology to deal with possible coagulopathies and family history.  I think this is low risk but appropriate referral - Ambulatory referral to Hematology - HYDROcodone-acetaminophen (NORCO/VICODIN) 5-325 MG tablet; Take 1 tablet by mouth every 4 (four) hours as needed.  Dispense: 25 tablet; Refill: 0  3. Psoriasis  - HYDROcodone-acetaminophen (NORCO/VICODIN) 5-325 MG tablet; Take 1 tablet by mouth every 4 (four) hours as needed.  Dispense: 25 tablet; Refill: 0  4. Screening for blood or protein in urine Urine has a trace of blood.  Repeat urine on future visit.  No sign of  infection today. - POCT urinalysis dipstick    No follow-ups on file.         Treylan Mcclintock Cranford Mon, MD  [facility] [phone] (phone) [phone] (fax)  [employer]

## 2019-10-31 NOTE — Progress Notes (Signed)
I,[name],acting as a scribe for Crystal Durie, MD.,have documented all relevant documentation on the behalf of Crystal Durie, MD,as directed by  Crystal Durie, MD while in the presence of Crystal Durie, MD.   Established patient visit   Patient: Crystal Russo   DOB: 09-Dec-1960   59 y.o. Female  MRN: [id number] Visit Date: 11/01/2019  Today's healthcare provider: [name], MD   Chief Complaint  Patient presents with  . Hospitalization Follow-up   Subjective    HPI  8 days ago the patient drove 6 hours in the car in 1 day.  A week ago she developed left leg pain and on 2 September she was diagnosed with a DVT and placed on Eliquis.  She comes in today for follow-up.  Her entire leg is swollen on the left and she has significant  pain.  She is only taken a hydrocodone at night but that is the only way she is able to get any sleep that she is in pain all the time.  She is now on Eliquis starter pack.  She did have a chest CT which was negative for PE and her O2 sat is 98% here in the office today. She also complains of some discomfort in the left CVA area without any urinary symptoms. Follow up ER visit  Patient was seen in ER for Fall on 10/26/2019. She was treated for; left leg pain, swelling. Blood clots in left leg. Treatment for this included; [place] of left leg. Given rx's for Eliquis 5 mg qd and Hydrocodone/Ace 5/325 mg prn. Advised to follow up with her pcp. She reports good compliance with treatment. She reports this condition is Unchanged.  -------------------------------------------------------------------  Patient still has pain and swelling in her left leg.  She is concerned about a family history of clots.  Her mother had multiple small CVAs.  Maternal aunt had DVT after breast cancer.  Father had DVTs after surgery.  Medications: Outpatient Medications Prior to Visit  Medication Sig  . Apremilast (OTEZLA) 30 MG TABS Take 1 tablet (30  mg total) by mouth 2 (two) times daily.  Marland Kitchen Apremilast (OTEZLA) 30 MG TABS Take 1 tablet (30 mg total) by mouth 2 (two) times daily.  . Cholecalciferol (VITAMIN D) 2000 units CAPS Take by mouth.  . clidinium-chlordiazePOXIDE (LIBRAX) 5-2.5 MG capsule Take 1 capsule by mouth 2 (two) times daily as needed.  . DULoxetine (CYMBALTA) 60 MG capsule Take 1 capsule (60 mg total) by mouth daily.  Marland Kitchen ELIQUIS DVT/PE STARTER PACK Take by mouth as directed.  Marland Kitchen estradiol (VIVELLE-DOT) 0.1 MG/24HR patch Place 1 patch onto the skin 2 (two) times daily.  . fluticasone (FLONASE) 50 MCG/ACT nasal spray USE 2 SPRAYS IN THE NOSE DAILY AS DIRECTED  . folic acid (FOLVITE) 1 MG tablet TAKE 1 TABLET DAILY  . HYDROcodone-acetaminophen (NORCO/VICODIN) 5-325 MG tablet Take 1 tablet by mouth every 6 (six) hours as needed.  Marland Kitchen levothyroxine (SYNTHROID) 150 MCG tablet Take 1 tablet (150 mcg total) by mouth daily before breakfast.  . metroNIDAZOLE (METROGEL) 0.75 % gel Apply 1 application topically 2 (two) times daily.  . RABEprazole (ACIPHEX) 20 MG tablet TAKE 1 TABLET TWICE A DAY  . topiramate (TOPAMAX) 50 MG tablet TAKE 1 TABLET DAILY  . ALPRAZolam (XANAX) 0.25 MG tablet 1 Every 8 hours as needed. (Patient not taking: Reported on 11/01/2019)  . amoxicillin (AMOXIL) 875 MG tablet Take 1 tablet (875 mg total) by mouth 2 (  two) times daily. (Patient not taking: Reported on 11/15/2017)  . betamethasone dipropionate (DIPROLENE) 0.05 % ointment Apply topically 2 (two) times daily. (Patient not taking: Reported on 11/01/2019)  . FERREX 150 150 MG capsule Take 150 mg by mouth daily. (Patient not taking: Reported on 11/01/2019)  . ibuprofen (ADVIL,MOTRIN) 800 MG tablet TAKE 1 TABLET TWICE A DAY AS NEEDED (Patient not taking: Reported on 12/05/2018)  . montelukast (SINGULAIR) 10 MG tablet Take 1 tablet (10 mg total) by mouth at bedtime. (Patient not taking: Reported on 11/01/2019)  . triamcinolone cream (KENALOG) 0.1 % Apply 1 application  topically 2 (two) times daily. (Patient not taking: Reported on 11/01/2019)   No facility-administered medications prior to visit.    Review of Systems  Constitutional: Negative for appetite change, chills, fatigue and fever.  Respiratory: Negative for chest tightness and shortness of breath.   Cardiovascular: Negative for chest pain and palpitations.  Gastrointestinal: Negative for abdominal pain, nausea and vomiting.  Neurological: Negative for dizziness and weakness.       Objective    BP 114/78 (BP Location: Left Arm, Patient Position: Sitting, Cuff Size: Large)   Pulse 89   Temp 98.3 F (36.8 C) (Oral)   Resp 16   Ht 5\' 9"  (1.753 m)   Wt 187 lb 6.4 oz (85 kg)   SpO2 98%   BMI 27.67 kg/m     Physical Exam    No results found for any visits on 11/01/19.  Assessment & Plan     1. DVT of deep femoral vein, left (HCC) DVT of entire left leg.  It is significantly swollen compared to the right.  Pulses are good and color is good as is capillary refill.  She is 6 days into documented DVT, on Eliquis.  Refer to vascular surgery for consideration of thrombectomy. Due to significant pain we will give her hydrocodone 5/325 to take every 4 hours as needed to get some sleep.  #25. 2. Coagulopathy ([place]) Patient concerned because of some family history of this.  She and her husband have 3 children.  Refer to hematology to deal with possible coagulopathies and family history.  I think this is low risk but appropriate referral - Ambulatory referral to Hematology - HYDROcodone-acetaminophen (NORCO/VICODIN) 5-325 MG tablet; Take 1 tablet by mouth every 4 (four) hours as needed.  Dispense: 25 tablet; Refill: 0  3. Psoriasis  - HYDROcodone-acetaminophen (NORCO/VICODIN) 5-325 MG tablet; Take 1 tablet by mouth every 4 (four) hours as needed.  Dispense: 25 tablet; Refill: 0  4. Screening for blood or protein in urine Urine has a trace of blood.  Repeat urine on future visit.  No sign of  infection today. - POCT urinalysis dipstick    No follow-ups on file.         Yancey Pedley Cranford Mon, MD  [facility] [phone] (phone) [phone] (fax)  [place]

## 2019-10-31 NOTE — Telephone Encounter (Signed)
Copied from [place] [id number]. Topic: General - Inquiry >> Oct 31, 2019  2:52 PM Scherrie Gerlach wrote: Reason for CRM: pt has appt tomorrow am with Dr Rosanna Randy at 9 am..  Pt would like you to get the records from there prior to her visit. Please fax request to [facility] hospital in [place]. fax  [phone] DOS  10/26/19  put on there STAT fax request. They told the pt she would not have to sign a release sice it is coming from her PCP.

## 2019-11-01 ENCOUNTER — Other Ambulatory Visit (INDEPENDENT_AMBULATORY_CARE_PROVIDER_SITE_OTHER): Payer: Self-pay | Admitting: Vascular Surgery

## 2019-11-01 ENCOUNTER — Encounter: Payer: Self-pay | Admitting: Family Medicine

## 2019-11-01 ENCOUNTER — Other Ambulatory Visit: Payer: Self-pay

## 2019-11-01 ENCOUNTER — Encounter (INDEPENDENT_AMBULATORY_CARE_PROVIDER_SITE_OTHER): Payer: Self-pay | Admitting: Nurse Practitioner

## 2019-11-01 ENCOUNTER — Telehealth (INDEPENDENT_AMBULATORY_CARE_PROVIDER_SITE_OTHER): Payer: Self-pay

## 2019-11-01 ENCOUNTER — Other Ambulatory Visit
Admission: RE | Admit: 2019-11-01 | Discharge: 2019-11-01 | Disposition: A | Discharge status: Home / Self Care | Payer: BC Managed Care – PPO | Source: Ambulatory Visit | Attending: Vascular Surgery | Admitting: Vascular Surgery

## 2019-11-01 ENCOUNTER — Ambulatory Visit (INDEPENDENT_AMBULATORY_CARE_PROVIDER_SITE_OTHER): Payer: BC Managed Care – PPO | Admitting: Family Medicine

## 2019-11-01 ENCOUNTER — Other Ambulatory Visit (INDEPENDENT_AMBULATORY_CARE_PROVIDER_SITE_OTHER): Payer: Self-pay | Admitting: Nurse Practitioner

## 2019-11-01 VITALS — BP 114/78 | HR 89 | Temp 98.3°F | Resp 16 | Ht 69.0 in | Wt 187.4 lb

## 2019-11-01 DIAGNOSIS — Z20822 Contact with and (suspected) exposure to covid-19: Secondary | ICD-10-CM | POA: Insufficient documentation

## 2019-11-01 DIAGNOSIS — D689 Coagulation defect, unspecified: Secondary | ICD-10-CM | POA: Diagnosis not present

## 2019-11-01 DIAGNOSIS — Z01818 Encounter for other preprocedural examination: Secondary | ICD-10-CM | POA: Diagnosis present

## 2019-11-01 DIAGNOSIS — I82412 Acute embolism and thrombosis of left femoral vein: Secondary | ICD-10-CM | POA: Diagnosis not present

## 2019-11-01 DIAGNOSIS — Z1389 Encounter for screening for other disorder: Secondary | ICD-10-CM

## 2019-11-01 DIAGNOSIS — I639 Cerebral infarction, unspecified: Secondary | ICD-10-CM

## 2019-11-01 LAB — POCT URINALYSIS DIPSTICK
Appearance: NORMAL
Bilirubin, UA: NEGATIVE
Glucose, UA: NEGATIVE
Ketones, UA: NEGATIVE
Leukocytes, UA: NEGATIVE
Nitrite, UA: NEGATIVE
Odor: NORMAL
Protein, UA: NEGATIVE
Spec Grav, UA: 1.02 (ref 1.010–1.025)
Urobilinogen, UA: 0.2 E.U./dL
pH, UA: 6 (ref 5.0–8.0)

## 2019-11-01 LAB — SARS CORONAVIRUS 2 (TAT 6-24 HRS): SARS Coronavirus 2: NEGATIVE

## 2019-11-01 MED ORDER — CEFAZOLIN SODIUM-DEXTROSE 2-4 GM/100ML-% IV SOLN
2.0000 g | Freq: Once | INTRAVENOUS | Status: AC
  Administered 2019-11-02: 2 g via INTRAVENOUS
  Filled 2019-11-01: qty 100

## 2019-11-01 MED ORDER — HYDROCODONE-ACETAMINOPHEN 5-325 MG PO TABS: 1.0000 | ORAL_TABLET | ORAL | 0 refills | Status: DC | PRN

## 2019-11-01 NOTE — Telephone Encounter (Signed)
Patient has appointment today and has to sign record release form.

## 2019-11-01 NOTE — Telephone Encounter (Signed)
Spoke with the patient and she is scheduled with Dr. Lucky Cowboy for a left leg thrombectomy on 11/02/19 with a 2:45 pm arrival time to the [facility]. Covid testing on 11/01/19 between 8-1 pm at the [facility]. Pre-procedure instructions were discussed.

## 2019-11-02 ENCOUNTER — Ambulatory Visit
Admission: RE | Admit: 2019-11-02 | Discharge: 2019-11-02 | Disposition: A | Discharge status: Home / Self Care | Payer: BC Managed Care – PPO | Attending: Vascular Surgery | Admitting: Vascular Surgery

## 2019-11-02 ENCOUNTER — Other Ambulatory Visit: Payer: Self-pay

## 2019-11-02 ENCOUNTER — Encounter: Payer: Self-pay | Admitting: Vascular Surgery

## 2019-11-02 ENCOUNTER — Encounter
Admission: RE | Disposition: A | Discharge status: Home / Self Care | Payer: Self-pay | Source: Home / Self Care | Attending: Vascular Surgery

## 2019-11-02 DIAGNOSIS — I82402 Acute embolism and thrombosis of unspecified deep veins of left lower extremity: Secondary | ICD-10-CM | POA: Insufficient documentation

## 2019-11-02 DIAGNOSIS — Z7901 Long term (current) use of anticoagulants: Secondary | ICD-10-CM | POA: Diagnosis not present

## 2019-11-02 DIAGNOSIS — L409 Psoriasis, unspecified: Secondary | ICD-10-CM | POA: Insufficient documentation

## 2019-11-02 DIAGNOSIS — Z79899 Other long term (current) drug therapy: Secondary | ICD-10-CM | POA: Diagnosis not present

## 2019-11-02 DIAGNOSIS — E039 Hypothyroidism, unspecified: Secondary | ICD-10-CM | POA: Insufficient documentation

## 2019-11-02 DIAGNOSIS — E785 Hyperlipidemia, unspecified: Secondary | ICD-10-CM | POA: Diagnosis not present

## 2019-11-02 DIAGNOSIS — D689 Coagulation defect, unspecified: Secondary | ICD-10-CM | POA: Insufficient documentation

## 2019-11-02 DIAGNOSIS — Z79890 Hormone replacement therapy (postmenopausal): Secondary | ICD-10-CM | POA: Insufficient documentation

## 2019-11-02 DIAGNOSIS — K219 Gastro-esophageal reflux disease without esophagitis: Secondary | ICD-10-CM | POA: Insufficient documentation

## 2019-11-02 DIAGNOSIS — F411 Generalized anxiety disorder: Secondary | ICD-10-CM | POA: Insufficient documentation

## 2019-11-02 HISTORY — DX: Chronic kidney disease, unspecified: N18.9

## 2019-11-02 HISTORY — DX: Hypothyroidism, unspecified: E03.9

## 2019-11-02 HISTORY — DX: Nonrheumatic mitral (valve) prolapse: I34.1

## 2019-11-02 HISTORY — PX: PERIPHERAL VASCULAR THROMBECTOMY: CATH118306

## 2019-11-02 HISTORY — DX: Irritable bowel syndrome, unspecified: K58.9

## 2019-11-02 SURGERY — PERIPHERAL VASCULAR THROMBECTOMY
Anesthesia: Moderate Sedation | Laterality: Left

## 2019-11-02 MED ORDER — METHYLPREDNISOLONE SODIUM SUCC 125 MG IJ SOLR: 125.0000 mg | Freq: Once | INTRAMUSCULAR | Status: DC | PRN

## 2019-11-02 MED ORDER — SODIUM CHLORIDE 0.9 % IV SOLN: INTRAVENOUS | Status: DC

## 2019-11-02 MED ORDER — HYDROMORPHONE HCL 1 MG/ML IJ SOLN: 1.0000 mg | Freq: Once | INTRAMUSCULAR | Status: DC | PRN

## 2019-11-02 MED ORDER — FAMOTIDINE 20 MG PO TABS: 40.0000 mg | ORAL_TABLET | Freq: Once | ORAL | Status: DC | PRN

## 2019-11-02 MED ORDER — FENTANYL CITRATE (PF) 100 MCG/2ML IJ SOLN
INTRAMUSCULAR | Status: DC | PRN
Start: 2019-11-02 — End: 2019-11-02
  Administered 2019-11-02: 50 ug via INTRAVENOUS

## 2019-11-02 MED ORDER — ONDANSETRON HCL 4 MG/2ML IJ SOLN: 4.0000 mg | Freq: Four times a day (QID) | INTRAMUSCULAR | Status: DC | PRN

## 2019-11-02 MED ORDER — ALTEPLASE 2 MG IJ SOLR
INTRAMUSCULAR | Status: DC | PRN
  Administered 2019-11-02: 8 mg

## 2019-11-02 MED ORDER — HEPARIN SODIUM (PORCINE) 1000 UNIT/ML IJ SOLN
INTRAMUSCULAR | Status: DC | PRN
  Administered 2019-11-02: 3000 [IU] via INTRAVENOUS

## 2019-11-02 MED ORDER — IODIXANOL 320 MG/ML IV SOLN
INTRAVENOUS | Status: DC | PRN
  Administered 2019-11-02: 30 mL

## 2019-11-02 MED ORDER — FENTANYL CITRATE (PF) 100 MCG/2ML IJ SOLN
INTRAMUSCULAR | Status: AC
  Filled 2019-11-02: qty 2

## 2019-11-02 MED ORDER — MIDAZOLAM HCL 2 MG/2ML IJ SOLN
INTRAMUSCULAR | Status: DC | PRN
  Administered 2019-11-02: 2 mg via INTRAVENOUS

## 2019-11-02 MED ORDER — HEPARIN SODIUM (PORCINE) 1000 UNIT/ML IJ SOLN
INTRAMUSCULAR | Status: AC
  Filled 2019-11-02: qty 1

## 2019-11-02 MED ORDER — DIPHENHYDRAMINE HCL 50 MG/ML IJ SOLN: 50.0000 mg | Freq: Once | INTRAMUSCULAR | Status: DC | PRN

## 2019-11-02 MED ORDER — MIDAZOLAM HCL 5 MG/5ML IJ SOLN
INTRAMUSCULAR | Status: AC
  Filled 2019-11-02: qty 5

## 2019-11-02 MED ORDER — MIDAZOLAM HCL 2 MG/ML PO SYRP: 8.0000 mg | ORAL_SOLUTION | Freq: Once | ORAL | Status: DC | PRN

## 2019-11-02 SURGICAL SUPPLY — 14 items
BALLN ATG 12X6X80 (BALLOONS) ×3
BALLN DORADO 10X80X80 (BALLOONS) ×3
BALLOON ATG 12X6X80 (BALLOONS) ×1 IMPLANT
BALLOON DORADO 10X80X80 (BALLOONS) ×1 IMPLANT
CANISTER PENUMBRA ENGINE (MISCELLANEOUS) ×3 IMPLANT
CANNULA 5F STIFF (CANNULA) ×3 IMPLANT
CATH BEACON 5 .035 65 KMP TIP (CATHETERS) ×3 IMPLANT
CATH INDIGO 12XTORQ 100 (CATHETERS) ×3 IMPLANT
CATH INDIGO SEP 12 (CATHETERS) ×3 IMPLANT
DEVICE PRESTO INFLATION (MISCELLANEOUS) ×3 IMPLANT
PACK ANGIOGRAPHY (CUSTOM PROCEDURE TRAY) ×3 IMPLANT
SHEATH PINNACLE 11FRX10 (SHEATH) ×3 IMPLANT
WIRE J 3MM .035X145CM (WIRE) ×3 IMPLANT
WIRE MAGIC TORQUE 315CM (WIRE) ×3 IMPLANT

## 2019-11-02 NOTE — OR Nursing (Signed)
Pt stood to move to chair for discharge. But left leg collapsed under her, she stated she cant feel left leg. Dr Lucky Cowboy notified, ordered to remove stocking. Dr Lucky Cowboy came by to assess pt oked to discharge.

## 2019-11-02 NOTE — Discharge Instructions (Signed)
Moderate Conscious Sedation, Adult Sedation is the use of medicines to promote relaxation and relieve discomfort and anxiety. Moderate conscious sedation is a type of sedation. Under moderate conscious sedation, you are less alert than normal, but you are still able to respond to instructions, touch, or both. Moderate conscious sedation is used during short medical and dental procedures. It is milder than deep sedation, which is a type of sedation under which you cannot be easily woken up. It is also milder than general anesthesia, which is the use of medicines to make you unconscious. Moderate conscious sedation allows you to return to your regular activities sooner. Tell a health care provider about:  Any allergies you have.  All medicines you are taking, including vitamins, herbs, eye drops, creams, and over-the-counter medicines.  Use of steroids (by mouth or creams).  Any problems you or family members have had with sedatives and anesthetic medicines.  Any blood disorders you have.  Any surgeries you have had.  Any medical conditions you have, such as sleep apnea.  Whether you are pregnant or may be pregnant.  Any use of cigarettes, alcohol, marijuana, or street drugs. What are the risks? Generally, this is a safe procedure. However, problems may occur, including:  Getting too much medicine (oversedation).  Nausea.  Allergic reaction to medicines.  Trouble breathing. If this happens, a breathing tube may be used to help with breathing. It will be removed when you are awake and breathing on your own.  Heart trouble.  Lung trouble. What happens before the procedure? Staying hydrated Follow instructions from your health care provider about hydration, which may include:  Up to 2 hours before the procedure - you may continue to drink clear liquids, such as water, clear fruit juice, black coffee, and plain tea. Eating and drinking restrictions Follow instructions from your  health care provider about eating and drinking, which may include:  8 hours before the procedure - stop eating heavy meals or foods such as meat, fried foods, or fatty foods.  6 hours before the procedure - stop eating light meals or foods, such as toast or cereal.  6 hours before the procedure - stop drinking milk or drinks that contain milk.  2 hours before the procedure - stop drinking clear liquids. Medicine Ask your health care provider about:  Changing or stopping your regular medicines. This is especially important if you are taking diabetes medicines or blood thinners.  Taking medicines such as aspirin and ibuprofen. These medicines can thin your blood. Do not take these medicines before your procedure if your health care provider instructs you not to.  Tests and exams  You will have a physical exam.  You may have blood tests done to show: ? How well your kidneys and liver are working. ? How well your blood can clot. General instructions  Plan to have someone take you home from the hospital or clinic.  If you will be going home right after the procedure, plan to have someone with you for 24 hours. What happens during the procedure?  An IV tube will be inserted into one of your veins.  Medicine to help you relax (sedative) will be given through the IV tube.  The medical or dental procedure will be performed. What happens after the procedure?  Your blood pressure, heart rate, breathing rate, and blood oxygen level will be monitored often until the medicines you were given have worn off.  Do not drive for 24 hours. This information is not   intended to replace advice given to you by your health care provider. Make sure you discuss any questions you have with your health care provider. Document Revised: 01/22/2017 Document Reviewed: 06/01/2015 Elsevier Patient Education  [place]. Catheter-Directed Thrombolysis, Care After This sheet gives you information about  how to care for yourself after your procedure. Your health care provider may also give you more specific instructions. If you have problems or questions, contact your health care provider. What can I expect after the procedure? After the procedure, it is common to have mild pain around your incision. Follow these instructions at home: Incision care   Follow instructions from your health care provider about how to take care of your incision. Make sure you: ? Wash your hands with soap and water before and after you change your bandage (dressing). If soap and water are not available, use hand sanitizer. ? Change your dressing as told by your health care provider.  Keep the incision area clean and dry.  Do not take baths, swim, or use a hot tub until your health care provider approves. Ask your health care provider if you may take showers. You may only be allowed to take sponge baths.  Check your incision area every day for signs of infection. Check for: ? Redness, swelling, or more pain. ? Fluid or blood. ? Warmth. ? Pus or a bad smell. Medicines  Take over-the-counter and prescription medicines only as told by your health care provider.  If you are taking blood thinners: ? Talk with your health care provider before you take any medicines that contain aspirin or NSAIDs, such as ibuprofen. These medicines increase your risk for dangerous bleeding. ? Take your medicine exactly as told, at the same time every day. ? Avoid activities that could cause injury or bruising, and follow instructions about how to prevent falls. ? Wear a medical alert bracelet or carry a card that lists what medicines you take. Activity   Do not drive until your health care provider approves.  Return to your normal activities as told by your health care provider. Ask your health care provider what activities are safe for you. General instructions   Raise (elevate) your legs above the level of your heart when  sitting or lying down. You can do this by putting pillows under your legs.  Wear compression stockings as told by your health care provider. These stockings help to prevent blood clots and reduce swelling in your legs.  Drink enough fluid to keep your urine pale yellow.  Do not use any products that contain nicotine or tobacco, such as cigarettes, e-cigarettes, and chewing tobacco. If you need help quitting, ask your health care provider.  Keep all follow-up visits as told by your health care provider. This is important. Contact a health care provider if:  You have redness, swelling, or more pain around your incision.  You have fluid or blood coming from your incision.  Your incision feels warm to the touch.  You have pus or a bad smell coming from your incision.  You have chills or a fever.  You bleed or bruise easily.  You have blood in your urine or stool. Get help right away if:  You have any symptoms of a stroke. "BE FAST" is an easy way to remember the main warning signs of a stroke: ? B - Balance. Signs are dizziness, sudden trouble walking, or loss of balance. ? E - Eyes. Signs are trouble seeing or a sudden change  in vision. ? F - Face. Signs are sudden weakness or numbness of the face, or the face or eyelid drooping on one side. ? A - Arms. Signs are weakness or numbness in an arm. This happens suddenly and usually on one side of the body. ? S - Speech. Signs are sudden trouble speaking, slurred speech, or trouble understanding what people say. ? T - Time. Time to call emergency services. Write down what time symptoms started.  You have other signs of a stroke, such as: ? A sudden, severe headache with no known cause. ? Nausea or vomiting. ? Seizure.  You have bleeding that does not stop after you apply pressure with your hands for several minutes.  You have chest pain.  You have difficulty breathing.  You cough up blood.  You have redness, warmth, swelling,  and pain in an arm or leg. These symptoms may represent a serious problem that is an emergency. Do not wait to see if the symptoms will go away. Get medical help right away. Call your local emergency services (911 in the [place] Do not drive yourself to the hospital. Summary  After the procedure, it is common to have mild pain around your incision.  Follow instructions from your health care provider about how to take care of your incision.  If you are taking blood thinners, talk with your health care provider before you take any medicines that contain aspirin or NSAIDs. Follow instructions about how to prevent falls.  Contact your health care provider if you have signs of an infected incision, such as redness, swelling, or more pain.  Get help right away if you have signs of stroke. BE FAST is an easy way to remember the warning signs of stroke. This information is not intended to replace advice given to you by your health care provider. Make sure you discuss any questions you have with your health care provider. Document Revised: 08/22/2018 Document Reviewed: 08/22/2018 Elsevier Patient Education  [place].

## 2019-11-02 NOTE — Interval H&P Note (Signed)
History and Physical Interval Note:  11/02/2019 2:46 PM  [place]  has presented today for surgery, with the diagnosis of DVT.  The various methods of treatment have been discussed with the patient and family. After consideration of risks, benefits and other options for treatment, the patient has consented to  Procedure(s): PERIPHERAL VASCULAR THROMBECTOMY / THROMBOLYSIS (Left) as a surgical intervention.  The patient's history has been reviewed, patient examined, no change in status, stable for surgery.  I have reviewed the patient's chart and labs.  Questions were answered to the patient's satisfaction.     Leotis Pain

## 2019-11-02 NOTE — OR Nursing (Signed)
Pt discharged to home with husband, pivoted without weight bearing, instructed to call Dr Lucky Cowboy office in am if feeling doesn't return.

## 2019-11-02 NOTE — Op Note (Signed)
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   US guidance for vascular access to left popliteal vein 2.   Catheter placement into left common iliac vein from left popliteal approach 3.   IVC gram and left lower extremity venogram 4.   Catheter directed thrombolysis with 8 mg of TPA  5.   Mechanical thrombectomy to to the left superficial femoral vein, common femoral vein, external and common iliac veins with the penumbra CAT 12 device 6.   PTA of the left common iliac artery with 10 and 12 she had some mm balloon    SURGEON: Leotis Pain, MD  ASSISTANT(S): none  ANESTHESIA: local with moderate conscious sedation for 38 minutes using 2 mg of Versed and 50 mcg of Fentanyl  ESTIMATED BLOOD LOSS: 350 cc  FINDING(S): 1.  Extensive thrombus from the left superficial femoral vein up through the common femoral vein and both the external and common iliac veins.  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  SPECIMEN(S):  none  INDICATIONS:    Patient is a 59 y.o. female who presents with extensive left lower extremity DVT with symptoms for about 1 week.  Patient has marked leg swelling and pain.  Venous intervention is performed to reduce the symtpoms and avoid long term postphlebitic symptoms.    DESCRIPTION: After obtaining full informed written consent, the patient was brought back to the vascular suite and placed supine upon the table. Moderate conscious sedation was administered during a face to face encounter with the patient throughout the procedure with my supervision of the RN administering medicines and monitoring the patient's vital signs, pulse oximetry, telemetry and mental status throughout from the start of the procedure  until the patient was taken to the recovery room.  After obtaining adequate anesthesia, the patient was prepped and draped in the standard fashion.    The patient was then placed into the prone position.  The left popliteal vein was then accessed under direct ultrasound guidance without difficulty with a micropuncture needle and a permanent image was recorded.  I then upsized to an 11Fr sheath over a J wire.  3000 units of heparin were then given.  Imaging showed extensive DVT with minimal flow.  A Kumpe catheter and Magic tourque wire were then advanced into the CFV and images were performed.  There remained extensive thrombus in the common femoral vein and the iliac veins.  The IVC was patent although it was collateral flow into the IVC initially.  I then used the headhunter catheter and instilled 8 mg of tpa throughout the femoral veins and iliac veins.  After this dwelled, I used the Penumbra Cat 12 catheter and evacuated about 250 cc of effluent with mechanical thrombectomy throughout the iliac veins, CFV, and SFV.  Passes were made with both the separator and without separator or wire with a CAT 12 catheter.  This had significant improvements with near resolution of the thrombus in the superficial vein, common femoral vein, and up into the external iliac vein but the common iliac vein remained nearly occluded.  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  I then put  a Magic torque wire up into the vena cava and performed angioplasty of the left common iliac vein.  A 10 mm diameter and then a 12 mm diameter balloon were inflated up to about 8 atm in the left common iliac vein.  I then image the area and had a catheter in the external iliac vein to visualize well.  There was a residual 30 to 40% residual stenosis in the left common iliac vein that did not appear flow-limiting with much more  brisk flow.  There was a mild amount of residual thrombus in this area and 1 more pass with the penumbra CAT 12 catheter was performed.  Following this, there has been marked improvement.  I then elected to terminate the procedure.  The sheath was removed and a dressing was placed.  She was taken to the recovery room in stable condition having tolerated the procedure well.    COMPLICATIONS: None  CONDITION: Stable  Leotis Pain 11/02/2019 5:21 PM

## 2019-11-02 NOTE — H&P (Signed)
[facility] Admission History & Physical  MRN : [id number]  Crystal Russo is a 59 y.o. (05/07/1960) female who presents with chief complaint of left lower extremity DVT planned peripheral venous thrombectomy / thrombolysis.  History of Present Illness:  The patient is a 59 year old female with a past medical history of generalized anxiety, GERD, hyperlipidemia, hypothyroidism, colitis, mild depression, multinodular goiter who presented with an extensive left lower extremity DVT.  Patient was referred to our office by Dr. Rosanna Randy.  Patient presented to her PCP yesterday with complaints of progressively worsening pain and swelling to the left lower extremity.  Patient endorses a history of driving approximately 8 hours about 1 week ago.  This is when discomfort her left lower extremity began.  Last week it is only become more painful and swollen.  Patient was found to have an extensive left lower extremity DVT PE and started on Eliquis.  CT chest was negative for PE.  Patient denies any shortness of breath or chest pain.  She denies any fever, nausea vomiting.  Patient presents today for a planned left lower extremity venous thrombectomy / thrombolysis.  Current Facility-Administered Medications  Medication Dose Route Frequency Provider Last Rate Last Admin  . 0.9 %  sodium chloride infusion   Intravenous Continuous Ibrohim Simmers A, PA-C      . ceFAZolin (ANCEF) IVPB 2g/100 mL premix  2 g Intravenous Once Naida Escalante A, PA-C      . diphenhydrAMINE (BENADRYL) injection 50 mg  50 mg Intravenous Once PRN Shawan Tosh A, PA-C      . famotidine (PEPCID) tablet 40 mg  40 mg Oral Once PRN Mahlia Fernando, Janalyn Harder, PA-C      . HYDROmorphone (DILAUDID) injection 1 mg  1 mg Intravenous Once PRN Marquice Uddin A, PA-C      . methylPREDNISolone sodium succinate (SOLU-MEDROL) 125 mg/2 mL injection 125 mg  125 mg Intravenous Once PRN Eliannah Hinde A, PA-C       . midazolam (VERSED) 2 MG/ML syrup 8 mg  8 mg Oral Once PRN Kiearra Oyervides A, PA-C      . ondansetron (ZOFRAN) injection 4 mg  4 mg Intravenous Q6H PRN Zannie Runkle, Janalyn Harder, PA-C       Past Medical History:  Diagnosis Date  . Cancer [facility])    skin cancer   Past Surgical History:  Procedure Laterality Date  . ABDOMINAL HYSTERECTOMY    . APPENDECTOMY    . BREAST SURGERY     left nipple biopsy  . MANDIBLE SURGERY    . SHOULDER SURGERY    . TUBAL LIGATION    . UPPER GI ENDOSCOPY  10/26/07   reflux esophagitis and a single gastric polyp   Social History Social History   Tobacco Use  . Smoking status: Never Smoker  . Smokeless tobacco: Never Used  Vaping Use  . Vaping Use: Never used  Substance Use Topics  . Alcohol use: Yes    Comment: once a month  . Drug use: No   Family History Family History  Problem Relation Age of Onset  . Hypertension Mother   . Heart disease Mother   . Arthritis Mother   . Graves' disease Mother   . Thyroid cancer Mother   . Fibroids Mother   . Diverticulosis Mother   . Basal cell carcinoma Mother   . Ulcers Father   . Hyperthyroidism Father   . Arthritis Father   . Congestive Heart Failure Father   .  Diverticulosis Father   . Lung cancer Father   . Fibroids Sister   . Hypertension Sister   . Fibromyalgia Sister   . Heart disease Sister   . Lupus Sister   . Hypertension Brother   . Hyperlipidemia Brother   . Skin cancer Brother   . Congenital heart disease Brother   . Stroke Brother   . Hypertension Brother   . Breast cancer Maternal Aunt   . Stroke Maternal Grandmother   . Stroke Maternal Grandfather   . Liver cancer Paternal Jon Gills   Denies family history of peripheral artery disease, venous disease or bleeding/clotting disorders.  No Known Allergies  REVIEW OF SYSTEMS (Negative unless checked)  Constitutional: [] Weight loss  [] Fever  [] Chills Cardiac: [] Chest pain   [] Chest pressure   [] Palpitations    [] Shortness of breath when laying flat   [] Shortness of breath at rest   [] Shortness of breath with exertion. Vascular:  [] Pain in legs with walking   [] Pain in legs at rest   [] Pain in legs when laying flat   [] Claudication   [] Pain in feet when walking  [] Pain in feet at rest  [] Pain in feet when laying flat   [] History of DVT   [] Phlebitis   [x] Swelling in legs   [] Varicose veins   [] Non-healing ulcers Pulmonary:   [] Uses home oxygen   [] Productive cough   [] Hemoptysis   [] Wheeze  [] COPD   [] Asthma Neurologic:  [] Dizziness  [] Blackouts   [] Seizures   [] History of stroke   [] History of TIA  [] Aphasia   [] Temporary blindness   [] Dysphagia   [] Weakness or numbness in arms   [] Weakness or numbness in legs Musculoskeletal:  [] Arthritis   [] Joint swelling   [] Joint pain   [] Low back pain Hematologic:  [] Easy bruising  [] Easy bleeding   [] Hypercoagulable state   [] Anemic  [] Hepatitis Gastrointestinal:  [] Blood in stool   [] Vomiting blood  [x] Gastroesophageal reflux/heartburn   [] Difficulty swallowing. Genitourinary:  [] Chronic kidney disease   [] Difficult urination  [] Frequent urination  [] Burning with urination   [] Blood in urine Skin:  [] Rashes   [] Ulcers   [] Wounds Psychological:  [x] History of anxiety   [x]  History of major depression.  Physical Examination  Vitals:   11/02/19 1449  BP: 128/66  Pulse: 82  Resp: 16  Temp: 97.8 F (36.6 C)  TempSrc: Oral  SpO2: 99%  Weight: 84.8 kg  Height: 5\' 9"  (1.753 m)   Body mass index is 27.62 kg/m. Gen: WD/WN, NAD Head: [place]/AT, No temporalis wasting.  Ear/Nose/Throat: Hearing grossly intact, nares w/o erythema or drainage, oropharynx w/o Erythema/Exudate, Eyes: Sclera non-icteric, conjunctiva clear Neck: Supple, no nuchal rigidity.  No JVD.  Pulmonary:  Good air movement, no increased work of respiration or use of accessory muscles  Cardiac: RRR, normal S1, S2, no Murmurs, rubs or gallops. Vascular:  Vessel Right Left  Radial Palpable Palpable   Ulnar Palpable Palpable  Brachial Palpable Palpable  Carotid Palpable, without bruit Palpable, without bruit  Aorta Not palpable N/A  Femoral Palpable Palpable  Popliteal Palpable Palpable  PT Palpable Palpable  DP Palpable Palpable   Left lower extremity: Thigh soft.  Calf soft.  Extremities warm distally toes.  Moderate edema.  There is no acute vascular compromise to extremity at this time.  Motor/sensory is intact.  Good capillary refill.  Gastrointestinal: soft, non-tender/non-distended. No guarding/reflex. No masses, surgical incisions, or scars. Musculoskeletal: M/S 5/5 throughout.  No deformity or atrophy.  moderate edema Neurologic: Sensation grossly intact in  extremities.  Symmetrical.  Speech is fluent. Motor exam as listed above. Psychiatric: Judgment intact, Mood & affect appropriate for pt's clinical situation. Dermatologic: No rashes or ulcers noted.  No cellulitis or open wounds. Lymph : No Cervical, Axillary, or Inguinal lymphadenopathy.  CBC Lab Results  Component Value Date   WBC 4.2 12/07/2018   HGB 12.9 12/07/2018   HCT 38.5 12/07/2018   MCV 89 12/07/2018   PLT 345 12/07/2018   BMET    Component Value Date/Time   NA 136 12/07/2018 1008   K 4.2 12/07/2018 1008   CL 104 12/07/2018 1008   CO2 22 12/07/2018 1008   GLUCOSE 93 12/07/2018 1008   BUN 17 12/07/2018 1008   CREATININE 0.85 12/07/2018 1008   CALCIUM 9.8 12/07/2018 1008   GFRNONAA 76 12/07/2018 1008   GFRAA 88 12/07/2018 1008   CrCl cannot be calculated (Patient's most recent lab result is older than the maximum 21 days allowed.).  COAG No results found for: INR, PROTIME  Radiology No results found.  Assessment/Plan The patient is a 59 year old female with a past medical history of generalized anxiety, GERD, hyperlipidemia, hypothyroidism, colitis, mild depression, multinodular goiter who presented with an extensive left lower extremity DVT.  Thank you for letting that extremely  important  1.  Extensive left lower extremity DVT: Patient presents with extensive left lower extremity DVT.  Seems provoked, most likely due to the patient's recent car ride.  No history of DVT or PE.  Patient has been started on Eliquis.  Due to the extensive nature of the DVT as well as progressively worsening symptoms.  Recommend patient undergo a left lower extremity venous thrombectomy/thrombolysis.  Procedure, risks and benefits were explained to the patient.  All questions were answered.  The patient wishes to proceed.  2.  Anticoagulation: The patient expresses her understanding that she will most likely be on anticoagulation for at least 6 months.  We will continue to surveil her DVT in the outpatient setting.  3.  Hyperlipidemia: Encouraged good control as its slows the progression of atherosclerotic disease  Discussed with Dr. Mayme Genta, PA-C  11/02/2019 2:58 PM

## 2019-11-03 ENCOUNTER — Encounter: Payer: Self-pay | Admitting: Vascular Surgery

## 2019-11-10 ENCOUNTER — Telehealth: Payer: Self-pay

## 2019-11-10 NOTE — Telephone Encounter (Signed)
Copied from [place] [id number]. Topic: Appointment Scheduling - Scheduling Inquiry for Clinic >> Nov 10, 2019 10:39 AM Oneta Rack wrote: Reason for CRM: patient was last seen 11/01/2019 and  unsure if physical is needed, patient is due 12/05/2018

## 2019-11-13 NOTE — Telephone Encounter (Signed)
Patient scheduled for physical in October.

## 2019-11-16 ENCOUNTER — Other Ambulatory Visit: Payer: Self-pay

## 2019-11-16 ENCOUNTER — Encounter: Payer: Self-pay | Admitting: Dermatology

## 2019-11-16 ENCOUNTER — Ambulatory Visit (INDEPENDENT_AMBULATORY_CARE_PROVIDER_SITE_OTHER): Payer: BC Managed Care – PPO | Admitting: Dermatology

## 2019-11-16 DIAGNOSIS — Z1283 Encounter for screening for malignant neoplasm of skin: Secondary | ICD-10-CM

## 2019-11-16 DIAGNOSIS — L578 Other skin changes due to chronic exposure to nonionizing radiation: Secondary | ICD-10-CM

## 2019-11-16 DIAGNOSIS — L820 Inflamed seborrheic keratosis: Secondary | ICD-10-CM | POA: Diagnosis not present

## 2019-11-16 DIAGNOSIS — L814 Other melanin hyperpigmentation: Secondary | ICD-10-CM

## 2019-11-16 DIAGNOSIS — L409 Psoriasis, unspecified: Secondary | ICD-10-CM

## 2019-11-16 DIAGNOSIS — L719 Rosacea, unspecified: Secondary | ICD-10-CM

## 2019-11-16 DIAGNOSIS — D1800 Hemangioma unspecified site: Secondary | ICD-10-CM

## 2019-11-16 DIAGNOSIS — D229 Melanocytic nevi, unspecified: Secondary | ICD-10-CM

## 2019-11-16 DIAGNOSIS — L821 Other seborrheic keratosis: Secondary | ICD-10-CM

## 2019-11-16 NOTE — Progress Notes (Signed)
Follow-Up Visit   Subjective  Crystal Russo is a 59 y.o. female who presents for the following: Patient here for full body skin exam and skin cancer screening.  She has noticed a lesion on her back that she would like checked and scattered dark spots on the body.   She also has psoriasis. Patient has been on [place] for about 2 months now and has noticed an improvement in symptoms. She does have occasional GI upset but is tolerating [place] Nail OK otherwise. Patient also using Sorilux foam to aa's PRN and Betamethasone ointment to aa's QD PRN. `  The following portions of the chart were reviewed this encounter and updated as appropriate:  Tobacco  Allergies  Meds  Problems  Med Hx  Surg Hx  Fam Hx     Review of Systems:  No other skin or systemic complaints except as noted in HPI or Assessment and Plan.  Objective  Well appearing patient in no apparent distress; mood and affect are within normal limits.  A full examination was performed including scalp, head, eyes, ears, nose, lips, neck, chest, axillae, abdomen, back, buttocks, bilateral upper extremities, bilateral lower extremities, hands, feet, fingers, toes, fingernails, and toenails. All findings within normal limits unless otherwise noted below.  Objective  feet: Scaly pink plaque of L foot, R foot clear.  Objective  Mid chest: Erythematous keratotic plaque.   Objective  Face: Mid face erythema with telangiectasias with rare inflammatory papules.   Assessment & Plan  Psoriasis feet  Chronic, Improved  Improved with [place] Nail - Some nausea (advised patient to take with food), patient did see a rheumatologist and was diagnosed with arthritis but not PsA.   Continue Sorilux foam QD to aa PRN flares.  Continue Otezla 30mg  po BID. Take with food.  Continue Betamethasone to aa's QD PRN flares up to 2 weeks. Avoid applying to face, groin, and axilla. Use as directed. Risk of skin atrophy with long-term use reviewed.    Topical steroids (such as triamcinolone, fluocinolone, fluocinonide, mometasone, clobetasol, halobetasol, betamethasone, hydrocortisone) can cause thinning and lightening of the skin if they are used for too long in the same area. Your physician has selected the right strength medicine for your problem and area affected on the body. Please use your medication only as directed by your physician to prevent side effects.    Other Related Medications Apremilast (OTEZLA) 30 MG TABS  Inflamed seborrheic keratosis Mid chest  Discussed cryotherapy versus trial of topical steroid. She prefers trial of topical steroid  apply Betamethasone to aa's QD-BID PRN up to 2 weeks. If not improving with topical treatment consider cryotherapy  Rosacea Face  Metronidazole gel caused burning  samples given of Soolantra given Patient to call for rx if able to tolerate.    Lentigines - Scattered tan macules - Discussed due to sun exposure - Benign, observe - Call for any changes  Seborrheic Keratoses - Stuck-on, waxy, tan-brown papules and plaques  - Discussed benign etiology and prognosis. - Observe - Call for any changes  Melanocytic Nevi - Tan-brown and/or pink-flesh-colored symmetric macules and papules - Benign appearing on exam today - Observation - Call clinic for new or changing moles - Recommend daily use of broad spectrum spf 30+ sunscreen to sun-exposed areas.   Hemangiomas - Red papules - Discussed benign nature - Observe - Call for any changes  Actinic Damage - Chronic, well-controlled - diffuse scaly erythematous macules with underlying dyspigmentation - Continue daily broad spectrum sunscreen SPF 30+  to sun-exposed areas, reapply every 2 hours as needed.  - Call for new or changing lesions.  Skin cancer screening performed today.  Return in about 1 year (around 11/15/2020) for TBSE.  Luther Redo, CMA, am acting as scribe for Forest Gleason, MD .   Documentation: I  have reviewed the above documentation for accuracy and completeness, and I agree with the above.  Forest Gleason, MD

## 2019-11-17 DIAGNOSIS — I82402 Acute embolism and thrombosis of unspecified deep veins of left lower extremity: Secondary | ICD-10-CM | POA: Insufficient documentation

## 2019-11-17 NOTE — Progress Notes (Signed)
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  INTERVAL HISTORY: Patient is a 59 year old female who recently was found to have an extensive left lower extremity DVT and underwent thrombolysis with thrombectomy.  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  She has a family history of CVAs and DVTs and is referred for hypercoagulable work-up.  He currently is taking Eliquis and tolerating treatment well.  She continues to have mild leg pain.  She has no neurologic complaints.  He denies any recent fevers or illnesses.  She has a good appetite and denies weight loss.  She has no chest pain, shortness of breath, cough, or hemoptysis.  She denies any nausea, vomiting, constipation, or diarrhea.  She has no urinary complaints.  Patient otherwise feels well and offers no further specific complaints today.  REVIEW OF SYSTEMS:   Review of Systems  Constitutional: Negative.  Negative for fever, malaise/fatigue and weight loss.  Respiratory: Negative.  Negative for cough, hemoptysis and shortness of breath.   Cardiovascular: Negative.  Negative for chest pain and leg swelling.  Gastrointestinal: Negative.  Negative for abdominal pain.  Genitourinary: Negative.  Negative for dysuria.  Musculoskeletal: Negative.  Negative for back pain.  Skin: Negative.  Negative for rash.  Neurological: Negative.  Negative for dizziness, focal weakness, weakness and headaches.  Psychiatric/Behavioral: Negative.  The patient is not nervous/anxious.     As per HPI. Otherwise, a complete review of systems is negative.  PAST MEDICAL HISTORY: Past Medical History:  Diagnosis Date  . [place]    skin cancer  . Chronic kidney disease     improved post discontinuing NSAIDS  . Hx of dysplastic nevus 10/13/2012   L proximal ant thigh, mild atypia  . Hx of dysplastic nevus 10/13/2012   L dorsum foot, mild atypia  . Hx of dysplastic nevus 10/22/2014   R ant lat ankle, mild atypia  . Hypothyroidism   . IBS (irritable bowel syndrome)   . Mitral valve prolapse     PAST SURGICAL HISTORY: Past Surgical History:  Procedure Laterality Date  . ABDOMINAL HYSTERECTOMY    . APPENDECTOMY    . BREAST SURGERY     left nipple biopsy  . MANDIBLE SURGERY    . PERIPHERAL VASCULAR THROMBECTOMY Left 11/02/2019   Procedure: PERIPHERAL VASCULAR THROMBECTOMY / THROMBOLYSIS;  Surgeon: Algernon Huxley, MD;  Location: [facility];  Service: Cardiovascular;  Laterality: Left;  . SHOULDER SURGERY    . TUBAL LIGATION    . UPPER GI ENDOSCOPY  10/26/07   reflux esophagitis and a single gastric polyp    FAMILY HISTORY: Family History  Problem Relation Age of Onset  . Hypertension Mother   . Heart disease Mother   . Arthritis Mother   . Graves' disease Mother   . Thyroid cancer Mother   . Fibroids Mother   . Diverticulosis Mother   . Basal cell carcinoma Mother   . Ulcers Father   . Hyperthyroidism Father   . Arthritis Father   . Congestive Heart Failure Father   . Diverticulosis Father   . Lung cancer Father   . Fibroids Sister   . Hypertension Sister   . Fibromyalgia Sister   . Heart disease Sister   .  Lupus Sister   . Hypertension Brother   . Hyperlipidemia Brother   . Skin cancer Brother   . Congenital heart disease Brother   . Stroke Brother   . Hypertension Brother   . Breast cancer Maternal Aunt   . Stroke Maternal Grandmother   . Stroke Maternal Grandfather   . Liver cancer Paternal Grandfather     ADVANCED DIRECTIVES (Y/N):  N  HEALTH MAINTENANCE: Social History   Tobacco Use  . Smoking status: Never Smoker  . Smokeless tobacco: Never Used  Vaping Use  . Vaping Use: Never used  Substance Use Topics  .  Alcohol use: Yes    Comment: once a month  . Drug use: No     Colonoscopy:  PAP:  Bone density:  Lipid panel:  Allergies  Allergen Reactions  . Nsaids     Current Outpatient Medications  Medication Sig Dispense Refill  . Apremilast (OTEZLA) 30 MG TABS Take 1 tablet (30 mg total) by mouth 2 (two) times daily. 28 tablet 12  . betamethasone dipropionate (DIPROLENE) 0.05 % ointment Apply topically 2 (two) times daily.     . Calcipotriene (SORILUX) 0.005 % FOAM Apply topically. Apply as needed on weekly on Saturday    . cetirizine (ZYRTEC) 10 MG tablet Take 10 mg by mouth daily.    . Cholecalciferol (VITAMIN D) 2000 units CAPS Take by mouth.    . clidinium-chlordiazePOXIDE (LIBRAX) 5-2.5 MG capsule Take 1 capsule by mouth 2 (two) times daily as needed. 180 capsule 3  . DULoxetine (CYMBALTA) 60 MG capsule Take 1 capsule (60 mg total) by mouth daily. 90 capsule 3  . ELIQUIS DVT/PE STARTER PACK Take 5 mg by mouth in the morning and at bedtime.     Marland Kitchen estradiol (VIVELLE-DOT) 0.1 MG/24HR patch Place 1 patch onto the skin 2 (two) times a week.   12  . fluticasone (FLONASE) 50 MCG/ACT nasal spray USE 2 SPRAYS IN THE NOSE DAILY AS DIRECTED (Patient taking differently: as needed. ) 16 g 5  . folic acid (FOLVITE) 1 MG tablet TAKE 1 TABLET DAILY 90 tablet 0  . levothyroxine (SYNTHROID) 150 MCG tablet Take 1 tablet (150 mcg total) by mouth daily before breakfast. 90 tablet 3  . RABEprazole (ACIPHEX) 20 MG tablet TAKE 1 TABLET TWICE A DAY 180 tablet 0  . topiramate (TOPAMAX) 50 MG tablet TAKE 1 TABLET DAILY 90 tablet 0   No current facility-administered medications for this visit.    OBJECTIVE: Vitals:   11/22/19 1128  BP: 111/72  Pulse: 85  Temp: 97.8 F (36.6 C)  SpO2: 99%     Body mass index is 26.03 kg/m.    ECOG FS:0 - Asymptomatic  General: Well-developed, well-nourished, no acute distress. Eyes: Pink conjunctiva, anicteric sclera. HEENT: Normocephalic, moist mucous  membranes. Lungs: No audible wheezing or coughing. Heart: Regular rate and rhythm. Abdomen: Soft, nontender, no obvious distention. Musculoskeletal: No edema, cyanosis, or clubbing. Neuro: Alert, answering all questions appropriately. Cranial nerves grossly intact. Skin: No rashes or petechiae noted. Psych: Normal affect. Lymphatics: No cervical, calvicular, axillary or inguinal LAD.   LAB RESULTS:  Lab Results  Component Value Date   NA 136 12/07/2018   K 4.2 12/07/2018   CL 104 12/07/2018   CO2 22 12/07/2018   GLUCOSE 93 12/07/2018   BUN 17 12/07/2018   CREATININE 0.85 12/07/2018   CALCIUM 9.8 12/07/2018   PROT 6.5 12/07/2018   ALBUMIN 4.2 12/07/2018   AST 17 12/07/2018   ALT  17 12/07/2018   ALKPHOS 90 12/07/2018   BILITOT 0.3 12/07/2018   GFRNONAA 76 12/07/2018   GFRAA 88 12/07/2018    Lab Results  Component Value Date   WBC 4.2 12/07/2018   NEUTROABS 1.9 12/07/2018   HGB 12.9 12/07/2018   HCT 38.5 12/07/2018   MCV 89 12/07/2018   PLT 345 12/07/2018     STUDIES: PERIPHERAL VASCULAR CATHETERIZATION  Result Date: 11/02/2019 See op note   ASSESSMENT: 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  PLAN:    1. 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  Patient also has follow-up with them on Friday, November 24, 2019.  Although the etiology of her DVT is likely anatomic, will proceed with full hypercoagulable work-up today for completeness.  If hypercoagulable work-up returns positive for May Thurner abnormality cannot be corrected, she may require lifelong anticoagulation.  Otherwise, she will require a minimum of 3 months of Eliquis but will consider extending treatment to 6 months given the extent of her DVT.  Patient will have a video assisted telemedicine visit in 1 month to discuss the results.  I spent a total of 45 minutes reviewing chart data, face-to-face evaluation with the patient, counseling and coordination of  care as detailed above.   Patient expressed understanding and was in agreement with this plan. She also understands that She can call clinic at any time with any questions, concerns, or complaints.    Lloyd Huger, MD   11/23/2019 6:50 AM

## 2019-11-21 ENCOUNTER — Encounter: Payer: Self-pay | Admitting: Dermatology

## 2019-11-21 ENCOUNTER — Encounter: Payer: Self-pay | Admitting: Oncology

## 2019-11-21 NOTE — Progress Notes (Signed)
New patient here as referral from Dr. Rosanna Randy for DVT left leg.  Patient denies any pain at this time.

## 2019-11-22 ENCOUNTER — Inpatient Hospital Stay: Payer: BC Managed Care – PPO | Attending: Oncology | Admitting: Oncology

## 2019-11-22 ENCOUNTER — Encounter: Payer: Self-pay | Admitting: Oncology

## 2019-11-22 ENCOUNTER — Inpatient Hospital Stay: Payer: BC Managed Care – PPO

## 2019-11-22 ENCOUNTER — Other Ambulatory Visit (INDEPENDENT_AMBULATORY_CARE_PROVIDER_SITE_OTHER): Payer: Self-pay | Admitting: Vascular Surgery

## 2019-11-22 ENCOUNTER — Other Ambulatory Visit: Payer: Self-pay

## 2019-11-22 DIAGNOSIS — Z7901 Long term (current) use of anticoagulants: Secondary | ICD-10-CM | POA: Diagnosis not present

## 2019-11-22 DIAGNOSIS — I82402 Acute embolism and thrombosis of unspecified deep veins of left lower extremity: Secondary | ICD-10-CM | POA: Diagnosis present

## 2019-11-22 DIAGNOSIS — I871 Compression of vein: Secondary | ICD-10-CM

## 2019-11-22 DIAGNOSIS — Z832 Family history of diseases of the blood and blood-forming organs and certain disorders involving the immune mechanism: Secondary | ICD-10-CM | POA: Insufficient documentation

## 2019-11-22 LAB — ANTITHROMBIN III: AntiThromb III Func: 115 % (ref 75–120)

## 2019-11-23 LAB — CARDIOLIPIN ANTIBODIES, IGG, IGM, IGA
Anticardiolipin IgA: <9 APL U/mL (ref 0–11)
Anticardiolipin IgG: <9 GPL U/mL (ref 0–14)
Anticardiolipin IgM: 11 MPL U/mL (ref 0–12)

## 2019-11-23 LAB — PROTEIN C, TOTAL: Protein C, Total: 136 % (ref 60–150)

## 2019-11-24 ENCOUNTER — Encounter (INDEPENDENT_AMBULATORY_CARE_PROVIDER_SITE_OTHER): Payer: Self-pay | Admitting: Nurse Practitioner

## 2019-11-24 ENCOUNTER — Other Ambulatory Visit: Payer: Self-pay

## 2019-11-24 ENCOUNTER — Ambulatory Visit (INDEPENDENT_AMBULATORY_CARE_PROVIDER_SITE_OTHER): Payer: BLUE CROSS/BLUE SHIELD | Admitting: Nurse Practitioner

## 2019-11-24 ENCOUNTER — Ambulatory Visit (INDEPENDENT_AMBULATORY_CARE_PROVIDER_SITE_OTHER): Payer: BLUE CROSS/BLUE SHIELD

## 2019-11-24 VITALS — BP 111/71 | HR 106 | Resp 16 | Wt 176.0 lb

## 2019-11-24 DIAGNOSIS — I82402 Acute embolism and thrombosis of unspecified deep veins of left lower extremity: Secondary | ICD-10-CM | POA: Diagnosis not present

## 2019-11-24 DIAGNOSIS — I871 Compression of vein: Secondary | ICD-10-CM

## 2019-11-24 LAB — BETA-2-GLYCOPROTEIN I ABS, IGG/M/A
Beta-2 Glyco I IgG: <9 GPI IgG units (ref 0–20)
Beta-2-Glycoprotein I IgA: <9 GPI IgA units (ref 0–25)
Beta-2-Glycoprotein I IgM: <9 GPI IgM units (ref 0–32)

## 2019-11-24 LAB — PROTEIN C ACTIVITY: Protein C Activity: 150 % (ref 73–180)

## 2019-11-24 LAB — LUPUS ANTICOAGULANT PANEL
DRVVT: 27.9 s (ref 0.0–47.0)
PTT Lupus Anticoagulant: 27.4 s (ref 0.0–51.9)

## 2019-11-24 LAB — PROTEIN S ACTIVITY: Protein S Activity: 64 % (ref 63–140)

## 2019-11-24 LAB — PROTEIN S, TOTAL: Protein S Ag, Total: 97 % (ref 60–150)

## 2019-11-24 MED ORDER — APIXABAN 5 MG PO TABS: 5.0000 mg | ORAL_TABLET | Freq: Two times a day (BID) | ORAL | 6 refills | Status: DC

## 2019-11-27 LAB — PROTHROMBIN GENE MUTATION

## 2019-11-27 LAB — FACTOR 5 LEIDEN

## 2019-11-28 ENCOUNTER — Encounter (INDEPENDENT_AMBULATORY_CARE_PROVIDER_SITE_OTHER): Payer: Self-pay | Admitting: Nurse Practitioner

## 2019-11-28 NOTE — Progress Notes (Signed)
Subjective:    Patient ID: Crystal Russo, female    DOB: 10-May-1960, 59 y.o.   MRN: [id number] Chief Complaint  Patient presents with  . Follow-up    [facility] 3wk le dvt ultrasound follow up    Patient follows up today post left lower extremity thrombectomy for a DVT on 11/02/2019. 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 She had intervention including:  PROCEDURE: 1. US guidance for vascular access to left popliteal vein 2. Catheter placement into left common iliac vein from left popliteal approach 3. IVC gram and left lower extremity venogram 4.   Catheter directed thrombolysis with 8 mg of TPA  5. Mechanical thrombectomy to to the left superficial femoral vein, common femoral vein, external and common iliac veins with the penumbra CAT 12 device 6. PTA of the left common iliac artery with 10 and 12 she had some mm balloon  The patient notes that the swelling is drastically improved since her thrombectomy. She notes that lower extremity feels much better with very little pain. She does have some swelling some days but has been mostly controlled. She has been doing very well with adhering to her anticoagulation in addition to wearing her medical grade 1 compression socks. The patient notes that this was her first DVT. It happened after a fall on 10/26/2019 where she had severe swelling and was subsequently diagnosed with a DVT at an outpatient emergency room. She denies any fever, chills, nausea, vomiting or excessive bleeding. Overall she is doing well.  Today noninvasive studies show no evidence of DVT in the left lower extremity with no indirect evidence of obstruction proximal to the inguinal ligament       Review of Systems  Cardiovascular: Positive for leg swelling.  All other systems reviewed and are negative.      Objective:   Physical Exam Vitals reviewed.  HENT:     Head: Normocephalic.  Cardiovascular:     Rate and Rhythm: Normal rate.      Pulses: Normal pulses.  Pulmonary:     Effort: Pulmonary effort is normal.  Musculoskeletal:     Right lower leg: No edema.  Neurological:     Mental Status: She is alert and oriented to person, place, and time.  Psychiatric:        Mood and Affect: Mood normal.        Behavior: Behavior normal.        Thought Content: Thought content normal.        Judgment: Judgment normal.     BP 111/71 (BP Location: Right Arm)   Pulse (!) 106   Resp 16   Wt 176 lb (79.8 kg)   BMI 25.99 kg/m   Past Medical History:  Diagnosis Date  . Cancer ([place])    skin cancer  . Chronic kidney disease    improved post discontinuing NSAIDS  . Hx of dysplastic nevus 10/13/2012   L proximal ant thigh, mild atypia  . Hx of dysplastic nevus 10/13/2012   L dorsum foot, mild atypia  . Hx of dysplastic nevus 10/22/2014   R ant lat ankle, mild atypia  . Hypothyroidism   . IBS (irritable bowel syndrome)   . Mitral valve prolapse     Social History   Socioeconomic History  . Marital status: Married    Spouse name: Not on file  . Number of children: Not on file  . Years of education: Not on file  . Highest education level:  Not on file  Occupational History  . Not on file  Tobacco Use  . Smoking status: Never Smoker  . Smokeless tobacco: Never Used  Vaping Use  . Vaping Use: Never used  Substance and Sexual Activity  . Alcohol use: Yes    Comment: once a month  . Drug use: No  . Sexual activity: Yes    Birth control/protection: None  Other Topics Concern  . Not on file  Social History Narrative  . Not on file   Social Determinants of Health   Financial Resource Strain:   . Difficulty of Paying Living Expenses: Not on file  Food Insecurity:   . Worried About Charity fundraiser in the Last Year: Not on file  . Ran Out of Food in the Last Year: Not on file  Transportation Needs:   . Lack of Transportation (Medical): Not on file  . Lack of Transportation (Non-Medical): Not on file    Physical Activity:   . Days of Exercise per Week: Not on file  . Minutes of Exercise per Session: Not on file  Stress:   . Feeling of Stress : Not on file  Social Connections:   . Frequency of Communication with Friends and Family: Not on file  . Frequency of Social Gatherings with Friends and Family: Not on file  . Attends Religious Services: Not on file  . Active Member of Clubs or Organizations: Not on file  . Attends Archivist Meetings: Not on file  . Marital Status: Not on file  Intimate Partner Violence:   . Fear of Current or Ex-Partner: Not on file  . Emotionally Abused: Not on file  . Physically Abused: Not on file  . Sexually Abused: Not on file    Past Surgical History:  Procedure Laterality Date  . ABDOMINAL HYSTERECTOMY    . APPENDECTOMY    . BREAST SURGERY     left nipple biopsy  . MANDIBLE SURGERY    . PERIPHERAL VASCULAR THROMBECTOMY Left 11/02/2019   Procedure: PERIPHERAL VASCULAR THROMBECTOMY / THROMBOLYSIS;  Surgeon: Algernon Huxley, MD;  Location: [place] [facility];  Service: Cardiovascular;  Laterality: Left;  . SHOULDER SURGERY    . TUBAL LIGATION    . UPPER GI ENDOSCOPY  10/26/07   reflux esophagitis and a single gastric polyp    Family History  Problem Relation Age of Onset  . Hypertension Mother   . Heart disease Mother   . Arthritis Mother   . Graves' disease Mother   . Thyroid cancer Mother   . Fibroids Mother   . Diverticulosis Mother   . Basal cell carcinoma Mother   . Ulcers Father   . Hyperthyroidism Father   . Arthritis Father   . Congestive Heart Failure Father   . Diverticulosis Father   . Lung cancer Father   . Fibroids Sister   . Hypertension Sister   . Fibromyalgia Sister   . Heart disease Sister   . Lupus Sister   . Hypertension Brother   . Hyperlipidemia Brother   . Skin cancer Brother   . Congenital heart disease Brother   . Stroke Brother   . Hypertension Brother   . Breast cancer Maternal Aunt   . Stroke  Maternal Grandmother   . Stroke Maternal Grandfather   . Liver cancer Paternal Grandfather     Allergies  Allergen Reactions  . Nsaids   . Other        Assessment & Plan:  1. Leg DVT (deep venous thromboembolism), acute, left (HCC) Today the evidence of thrombus has resolved in her left lower extremity.  The patient does continue to have some swelling however it is currently reduced from prior to her procedure.  The patient is tolerating her Eliquis well.  We discussed the pathophysiology of deep vein thrombosis as well as expected signs and symptoms versus normal postphlebitic swelling and discomfort.  The patient will continue to remain on Eliquis for 6 months and then at which point we will discuss a transition to a prophylactic dose versus cessation of anticoagulation.  Patient is advised to utilize her medical grade 1 compression socks on a daily basis in addition to elevation and exercise.  2. May-Thurner syndrome Discussed with patient that routine monitoring for meter syndrome is not typically done however sudden severe swelling of her left lower extremity would indicate possible issue.  Patient will continue with conservative therapy including medical grade 1 compression such as outlined above.   Current Outpatient Medications on File Prior to Visit  Medication Sig Dispense Refill  . Apremilast (OTEZLA) 30 MG TABS Take 1 tablet (30 mg total) by mouth 2 (two) times daily. 28 tablet 12  . betamethasone dipropionate (DIPROLENE) 0.05 % ointment Apply topically 2 (two) times daily.     . Calcipotriene (SORILUX) 0.005 % FOAM Apply topically. Apply as needed on weekly on Saturday    . cetirizine (ZYRTEC) 10 MG tablet Take 10 mg by mouth daily.    . Cholecalciferol (VITAMIN D) 2000 units CAPS Take by mouth.    . clidinium-chlordiazePOXIDE (LIBRAX) 5-2.5 MG capsule Take 1 capsule by mouth 2 (two) times daily as needed. 180 capsule 3  . DULoxetine (CYMBALTA) 60 MG capsule Take 1 capsule  (60 mg total) by mouth daily. 90 capsule 3  . estradiol (VIVELLE-DOT) 0.1 MG/24HR patch Place 1 patch onto the skin 2 (two) times a week.   12  . fluticasone (FLONASE) 50 MCG/ACT nasal spray USE 2 SPRAYS IN THE NOSE DAILY AS DIRECTED (Patient taking differently: as needed. ) 16 g 5  . folic acid (FOLVITE) 1 MG tablet TAKE 1 TABLET DAILY 90 tablet 0  . levothyroxine (SYNTHROID) 150 MCG tablet Take 1 tablet (150 mcg total) by mouth daily before breakfast. 90 tablet 3  . RABEprazole (ACIPHEX) 20 MG tablet TAKE 1 TABLET TWICE A DAY 180 tablet 0  . topiramate (TOPAMAX) 50 MG tablet TAKE 1 TABLET DAILY 90 tablet 0   No current facility-administered medications on file prior to visit.    There are no Patient Instructions on file for this visit. No follow-ups on file.   Kris Hartmann, NP

## 2019-12-05 ENCOUNTER — Telehealth: Payer: Self-pay

## 2019-12-05 NOTE — Telephone Encounter (Signed)
Second level appeal faxed to insurance and [place] 12/04/2019 to keep patient on Bridge therapy.

## 2019-12-06 ENCOUNTER — Other Ambulatory Visit: Payer: Self-pay | Admitting: Family Medicine

## 2019-12-06 DIAGNOSIS — Z8669 Personal history of other diseases of the nervous system and sense organs: Secondary | ICD-10-CM

## 2019-12-06 DIAGNOSIS — K219 Gastro-esophageal reflux disease without esophagitis: Secondary | ICD-10-CM

## 2019-12-06 DIAGNOSIS — D649 Anemia, unspecified: Secondary | ICD-10-CM

## 2019-12-06 NOTE — Telephone Encounter (Signed)
Requested Prescriptions  Pending Prescriptions Disp Refills   RABEprazole (ACIPHEX) 20 MG tablet [Pharmacy Med Name: RABEPRAZOLE SODIUM DR TABS 20MG ] 180 tablet 3    Sig: TAKE 1 TABLET TWICE A DAY     Gastroenterology: Proton Pump Inhibitors Passed - 12/06/2019  1:14 AM      Passed - Valid encounter within last 12 months    Recent Outpatient Visits          1 month ago DVT of deep femoral vein, left [facility])   [facility] Jerrol Banana., MD   1 year ago Annual physical exam   [facility] Jerrol Banana., MD   2 years ago Annual physical exam   [facility] Jerrol Banana., MD   2 years ago Adult hypothyroidism   [facility] Jerrol Banana., MD   2 years ago Swelling of right foot   [facility] Jerrol Banana., MD      Future Appointments            In 2 weeks Grayland Ormond, Kathlene November, MD [facility]   In 2 weeks Jerrol Banana., MD [facility] [place]   In 11 months [place], [place], MD [place]            folic acid (FOLVITE) 1 MG tablet [Pharmacy Med Name: FOLIC ACID TABS 1MG ] 90 tablet 3    Sig: TAKE 1 TABLET DAILY     Endocrinology:  Vitamins Passed - 12/06/2019  1:14 AM      Passed - Valid encounter within last 12 months    Recent Outpatient Visits          1 month ago DVT of deep femoral vein, left [facility])   [facility] Jerrol Banana., MD   1 year ago Annual physical exam   [facility] Jerrol Banana., MD   2 years ago Annual physical exam   [facility] Jerrol Banana., MD   2 years ago Adult hypothyroidism   [facility] Jerrol Banana., MD   2 years ago Swelling of right foot   [facility] Jerrol Banana., MD      Future Appointments            In 2 weeks Grayland Ormond Kathlene November, MD  [facility]   In 2 weeks Jerrol Banana., MD [facility], [place]   In 11 months [place], [place], [place]            topiramate (TOPAMAX) 50 MG tablet [Pharmacy Med Name: TOPIRAMATE TABS 50MG ] 90 tablet 3    Sig: TAKE 1 TABLET DAILY     Not Delegated - Neurology: Anticonvulsants - topiramate & zonisamide Failed - 12/06/2019  1:14 AM      Failed - This refill cannot be delegated      Failed - Cr in normal range and within 360 days    Creatinine, Ser  Date Value Ref Range Status  12/07/2018 0.85 0.57 - 1.00 mg/dL Final         Failed - CO2 in normal range and within 360 days    CO2  Date Value Ref Range Status  12/07/2018 22 20 - 29 mmol/L Final         Passed - Valid encounter within last 12 months  Recent Outpatient Visits          1 month ago DVT of deep femoral vein, left [facility])   [facility] Jerrol Banana., MD   1 year ago Annual physical exam   [facility] Jerrol Banana., MD   2 years ago Annual physical exam   [facility] Jerrol Banana., MD   2 years ago Adult hypothyroidism   [facility] Jerrol Banana., MD   2 years ago Swelling of right foot   [facility] Jerrol Banana., MD      Future Appointments            In 2 weeks Grayland Ormond, Kathlene November, MD [facility]   In 2 weeks Jerrol Banana., MD [facility], [place]   In 11 months [facility], [place], MD [place]

## 2019-12-06 NOTE — Telephone Encounter (Signed)
Requested medications are due for refill today?  Yes - This medication refill cannot be delegated.    Requested medications are on active medication list?  Yes  Last Refill:   09/07/2019  # 90 with no refills   Future visit scheduled?  Yes  Notes to Clinic:  This medication refill cannot be delegated.

## 2019-12-20 ENCOUNTER — Telehealth: Payer: Self-pay

## 2019-12-20 MED ORDER — OTEZLA 30 MG PO TABS: 30.0000 mg | ORAL_TABLET | Freq: Two times a day (BID) | ORAL | 5 refills | Status: DC

## 2019-12-20 NOTE — Telephone Encounter (Signed)
Bridge [employer]. Patient mailed information how to enroll.

## 2019-12-21 ENCOUNTER — Ambulatory Visit: Payer: Self-pay | Admitting: Family Medicine

## 2019-12-23 NOTE — Progress Notes (Signed)
[facility]  Telephone:([phone]) [phone] Fax:(336) [phone]  ID: Crystal Russo OB: October 29, 1960  MR#: [id number]  QIW#:[id number]  Patient Care Team: Jerrol Banana., MD as PCP - General (Family Medicine)  I connected with Jordan Likes Degan on 12/26/19 at  2:30 PM EDT by video enabled telemedicine visit and verified that I am speaking with the correct person using two identifiers.   I discussed the limitations, risks, security and privacy concerns of performing an evaluation and management service by telemedicine and the availability of in-person appointments. I also discussed with the patient that there may be a patient responsible charge related to this service. The patient expressed understanding and agreed to proceed.   Other persons participating in the visit and their role in the encounter: Patient, MD.  Patient's location: Home. Provider's location: Clinic.  CHIEF COMPLAINT: 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  INTERVAL HISTORY: Patient agreed to video assisted telemedicine visit for further evaluation and discussion of her laboratory results. She is tolerating Eliquis well without significant side effects. She currently feels well and is asymptomatic. She has no neurologic complaints.  He denies any recent fevers or illnesses.  She has a good appetite and denies weight loss.  She has no chest pain, shortness of breath, cough, or hemoptysis.  She denies any nausea, vomiting, constipation, or diarrhea.  She has no urinary complaints. Patient offers no specific complaints today.  REVIEW OF SYSTEMS:   Review of Systems  Constitutional: Negative.  Negative for fever, malaise/fatigue and weight loss.  Respiratory: Negative.  Negative for cough, hemoptysis and shortness of breath.   Cardiovascular: Negative.  Negative for chest pain and leg swelling.  Gastrointestinal: Negative.  Negative for abdominal pain.  Genitourinary: Negative.  Negative for dysuria.    Musculoskeletal: Negative.  Negative for back pain.  Skin: Negative.  Negative for rash.  Neurological: Negative.  Negative for dizziness, focal weakness, weakness and headaches.  Psychiatric/Behavioral: Negative.  The patient is not nervous/anxious.     As per HPI. Otherwise, a complete review of systems is negative.  PAST MEDICAL HISTORY: Past Medical History:  Diagnosis Date  . Cancer ([place])    skin cancer  . Chronic kidney disease    improved post discontinuing NSAIDS  . Hx of dysplastic nevus 10/13/2012   L proximal ant thigh, mild atypia  . Hx of dysplastic nevus 10/13/2012   L dorsum foot, mild atypia  . Hx of dysplastic nevus 10/22/2014   R ant lat ankle, mild atypia  . Hypothyroidism   . IBS (irritable bowel syndrome)   . Mitral valve prolapse     PAST SURGICAL HISTORY: Past Surgical History:  Procedure Laterality Date  . ABDOMINAL HYSTERECTOMY    . APPENDECTOMY    . BREAST SURGERY     left nipple biopsy  . MANDIBLE SURGERY    . PERIPHERAL VASCULAR THROMBECTOMY Left 11/02/2019   Procedure: PERIPHERAL VASCULAR THROMBECTOMY / THROMBOLYSIS;  Surgeon: Algernon Huxley, MD;  Location: [facility];  Service: Cardiovascular;  Laterality: Left;  . SHOULDER SURGERY    . TUBAL LIGATION    . UPPER GI ENDOSCOPY  10/26/07   reflux esophagitis and a single gastric polyp    FAMILY HISTORY: Family History  Problem Relation Age of Onset  . Hypertension Mother   . Heart disease Mother   . Arthritis Mother   . Graves' disease Mother   . Thyroid cancer Mother   . Fibroids Mother   . Diverticulosis  Mother   . Basal cell carcinoma Mother   . Ulcers Father   . Hyperthyroidism Father   . Arthritis Father   . Congestive Heart Failure Father   . Diverticulosis Father   . Lung cancer Father   . Fibroids Sister   . Hypertension Sister   . Fibromyalgia Sister   . Heart disease Sister   . Lupus Sister   . Hypertension Brother   . Hyperlipidemia Brother   . Skin cancer  Brother   . Congenital heart disease Brother   . Stroke Brother   . Hypertension Brother   . Breast cancer Maternal Aunt   . Stroke Maternal Grandmother   . Stroke Maternal Grandfather   . Liver cancer Paternal Grandfather     ADVANCED DIRECTIVES (Y/N):  N  HEALTH MAINTENANCE: Social History   Tobacco Use  . Smoking status: Never Smoker  . Smokeless tobacco: Never Used  Vaping Use  . Vaping Use: Never used  Substance Use Topics  . Alcohol use: Yes    Comment: once a month  . Drug use: No     Colonoscopy:  PAP:  Bone density:  Lipid panel:  Allergies  Allergen Reactions  . Nsaids   . Other     Current Outpatient Medications  Medication Sig Dispense Refill  . apixaban (ELIQUIS) 5 MG TABS tablet Take 1 tablet (5 mg total) by mouth 2 (two) times daily. 60 tablet 6  . Apremilast (OTEZLA) 30 MG TABS Take 1 tablet (30 mg total) by mouth 2 (two) times daily. 28 tablet 12  . Apremilast (OTEZLA) 30 MG TABS Take 1 tablet (30 mg total) by mouth 2 (two) times daily. 60 tablet 5  . betamethasone dipropionate (DIPROLENE) 0.05 % ointment Apply topically 2 (two) times daily.     . Calcipotriene (SORILUX) 0.005 % FOAM Apply topically. Apply as needed on weekly on Saturday    . cetirizine (ZYRTEC) 10 MG tablet Take 10 mg by mouth daily.    . Cholecalciferol (VITAMIN D) 2000 units CAPS Take by mouth.    . clidinium-chlordiazePOXIDE (LIBRAX) 5-2.5 MG capsule Take 1 capsule by mouth 2 (two) times daily as needed. 180 capsule 3  . DULoxetine (CYMBALTA) 60 MG capsule Take 1 capsule (60 mg total) by mouth daily. 90 capsule 3  . estradiol (VIVELLE-DOT) 0.1 MG/24HR patch Place 1 patch onto the skin 2 (two) times a week.   12  . fluticasone (FLONASE) 50 MCG/ACT nasal spray USE 2 SPRAYS IN THE NOSE DAILY AS DIRECTED (Patient taking differently: as needed. ) 16 g 5  . folic acid (FOLVITE) 1 MG tablet TAKE 1 TABLET DAILY 90 tablet 3  . levothyroxine (SYNTHROID) 150 MCG tablet Take 1 tablet (150  mcg total) by mouth daily before breakfast. 90 tablet 3  . RABEprazole (ACIPHEX) 20 MG tablet TAKE 1 TABLET TWICE A DAY 180 tablet 3  . topiramate (TOPAMAX) 50 MG tablet TAKE 1 TABLET DAILY 90 tablet 3   No current facility-administered medications for this visit.    OBJECTIVE: There were no vitals filed for this visit.   There is no height or weight on file to calculate BMI.    ECOG FS:0 - Asymptomatic  General: Well-developed, well-nourished, no acute distress. HEENT: Normocephalic. Neuro: Alert, answering all questions appropriately. Cranial nerves grossly intact. Psych: Normal affect.   LAB RESULTS:  Lab Results  Component Value Date   NA 136 12/07/2018   K 4.2 12/07/2018   CL 104 12/07/2018   CO2  22 12/07/2018   GLUCOSE 93 12/07/2018   BUN 17 12/07/2018   CREATININE 0.85 12/07/2018   CALCIUM 9.8 12/07/2018   PROT 6.5 12/07/2018   ALBUMIN 4.2 12/07/2018   AST 17 12/07/2018   ALT 17 12/07/2018   ALKPHOS 90 12/07/2018   BILITOT 0.3 12/07/2018   GFRNONAA 76 12/07/2018   GFRAA 88 12/07/2018    Lab Results  Component Value Date   WBC 4.2 12/07/2018   NEUTROABS 1.9 12/07/2018   HGB 12.9 12/07/2018   HCT 38.5 12/07/2018   MCV 89 12/07/2018   PLT 345 12/07/2018     STUDIES: No results found.  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  PLAN:    1. 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 Repeat lower extremity ultrasound was negative for DVT. Full hypercoagulable work-up is negative. No intervention is needed at this time. Patient has close follow-up with vascular surgery who is also managing her Eliquis dosing. Agree with their recommendations. No further intervention is needed. No further follow-up is necessary. Please refer patient back if there are any questions or concerns.   I provided 20 minutes of face-to-face video visit time during this encounter which included chart review, counseling, and  coordination of care as documented above.   Patient expressed understanding and was in agreement with this plan. She also understands that She can call clinic at any time with any questions, concerns, or complaints.    Lloyd Huger, MD   12/26/2019 6:49 AM

## 2019-12-25 ENCOUNTER — Encounter: Payer: Self-pay | Admitting: Oncology

## 2019-12-25 ENCOUNTER — Inpatient Hospital Stay: Payer: BC Managed Care – PPO | Attending: Oncology | Admitting: Oncology

## 2019-12-25 DIAGNOSIS — I82402 Acute embolism and thrombosis of unspecified deep veins of left lower extremity: Secondary | ICD-10-CM

## 2019-12-26 ENCOUNTER — Encounter: Payer: Self-pay | Admitting: Family Medicine

## 2019-12-26 ENCOUNTER — Ambulatory Visit (INDEPENDENT_AMBULATORY_CARE_PROVIDER_SITE_OTHER): Payer: BC Managed Care – PPO | Admitting: Family Medicine

## 2019-12-26 ENCOUNTER — Other Ambulatory Visit: Payer: Self-pay

## 2019-12-26 VITALS — BP 112/53 | HR 81 | Temp 98.6°F | Resp 16 | Ht 69.0 in | Wt 178.0 lb

## 2019-12-26 DIAGNOSIS — Z23 Encounter for immunization: Secondary | ICD-10-CM

## 2019-12-26 DIAGNOSIS — E785 Hyperlipidemia, unspecified: Secondary | ICD-10-CM

## 2019-12-26 DIAGNOSIS — Z0000 Encounter for general adult medical examination without abnormal findings: Secondary | ICD-10-CM

## 2019-12-26 DIAGNOSIS — Z1389 Encounter for screening for other disorder: Secondary | ICD-10-CM | POA: Diagnosis not present

## 2019-12-26 LAB — POCT URINALYSIS DIPSTICK
Appearance: NORMAL
Bilirubin, UA: NEGATIVE
Blood, UA: NEGATIVE
Glucose, UA: NEGATIVE
Ketones, UA: NEGATIVE
Leukocytes, UA: NEGATIVE
Nitrite, UA: NEGATIVE
Odor: NORMAL
Protein, UA: NEGATIVE
Spec Grav, UA: 1.01 (ref 1.010–1.025)
Urobilinogen, UA: 0.2 E.U./dL
pH, UA: 6 (ref 5.0–8.0)

## 2019-12-26 NOTE — Progress Notes (Signed)
I,[name],acting as a scribe for [name], MD.,have documented all relevant documentation on the behalf of [name], MD,as directed by  [name], MD while in the presence of [name], MD.   Complete physical exam   Patient: Crystal Russo   DOB: [birth year]   59 y.o. Female  MRN: [id number] Visit Date: 12/26/2019  Today's healthcare provider: [name], MD   Chief Complaint  Patient presents with  . Annual Exam   Subjective    Crystal Russo is a 59 y.o. female who presents today for a complete physical exam.  She reports consuming a general diet. Home exercise routine includes walking. She generally feels well. She reports sleeping well. She does not have additional problems to discuss today.  She is recovering well from recent DVT and thrombectomy. HPI    Past Medical History:  Diagnosis Date  . Cancer ([place])    skin cancer  . Chronic kidney disease    improved post discontinuing NSAIDS  . Hx of dysplastic nevus 10/13/2012   L proximal ant thigh, mild atypia  . Hx of dysplastic nevus 10/13/2012   L dorsum foot, mild atypia  . Hx of dysplastic nevus 10/22/2014   R ant lat ankle, mild atypia  . Hypothyroidism   . IBS (irritable bowel syndrome)   . Mitral valve prolapse    Past Surgical History:  Procedure Laterality Date  . ABDOMINAL HYSTERECTOMY    . APPENDECTOMY    . BREAST SURGERY     left nipple biopsy  . MANDIBLE SURGERY    . PERIPHERAL VASCULAR THROMBECTOMY Left 11/02/2019   Procedure: PERIPHERAL VASCULAR THROMBECTOMY / THROMBOLYSIS;  Surgeon: Algernon Huxley, MD;  Location: [facility];  Service: Cardiovascular;  Laterality: Left;  . SHOULDER SURGERY    . TUBAL LIGATION    . UPPER GI ENDOSCOPY  10/26/07   reflux esophagitis and a single gastric polyp   Social History   Socioeconomic History  . Marital status: Married    Spouse name: Not on file  . Number of children: Not on file  . Years of  education: Not on file  . Highest education level: Not on file  Occupational History  . Not on file  Tobacco Use  . Smoking status: Never Smoker  . Smokeless tobacco: Never Used  Vaping Use  . Vaping Use: Never used  Substance and Sexual Activity  . Alcohol use: Yes    Comment: once a month  . Drug use: No  . Sexual activity: Yes    Birth control/protection: None  Other Topics Concern  . Not on file  Social History Narrative  . Not on file   Social Determinants of Health   Financial Resource Strain:   . Difficulty of Paying Living Expenses: Not on file  Food Insecurity:   . Worried About Charity fundraiser in the Last Year: Not on file  . Ran Out of Food in the Last Year: Not on file  Transportation Needs:   . Lack of Transportation (Medical): Not on file  . Lack of Transportation (Non-Medical): Not on file  Physical Activity:   . Days of Exercise per Week: Not on file  . Minutes of Exercise per Session: Not on file  Stress:   . Feeling of Stress : Not on file  Social Connections:   . Frequency of Communication with Friends and Family: Not on file  . Frequency of Social Gatherings  with Friends and Family: Not on file  . Attends Religious Services: Not on file  . Active Member of Clubs or Organizations: Not on file  . Attends Archivist Meetings: Not on file  . Marital Status: Not on file  Intimate Partner Violence:   . Fear of Current or Ex-Partner: Not on file  . Emotionally Abused: Not on file  . Physically Abused: Not on file  . Sexually Abused: Not on file   Family Status  Relation Name Status  . Mother  Alive  . Father  Alive  . Sister  Alive  . Brother  Alive  . Brother  Deceased  . Brother  Alive  . Daughter  Alive  . Son  Alive  . Daughter  Alive  . Mat Aunt  (Not Specified)  . MGM  (Not Specified)  . MGF  (Not Specified)  . PGF  (Not Specified)   Family History  Problem Relation Age of Onset  . Hypertension Mother   . Heart  disease Mother   . Arthritis Mother   . Graves' disease Mother   . Thyroid cancer Mother   . Fibroids Mother   . Diverticulosis Mother   . Basal cell carcinoma Mother   . Ulcers Father   . Hyperthyroidism Father   . Arthritis Father   . Congestive Heart Failure Father   . Diverticulosis Father   . Lung cancer Father   . Fibroids Sister   . Hypertension Sister   . Fibromyalgia Sister   . Heart disease Sister   . Lupus Sister   . Hypertension Brother   . Hyperlipidemia Brother   . Skin cancer Brother   . Congenital heart disease Brother   . Stroke Brother   . Hypertension Brother   . Breast cancer Maternal Aunt   . Stroke Maternal Grandmother   . Stroke Maternal Grandfather   . Liver cancer Paternal Grandfather    Allergies  Allergen Reactions  . Nsaids   . Other     Patient Care Team: Jerrol Banana., MD as PCP - General (Family Medicine)   Medications: Outpatient Medications Prior to Visit  Medication Sig  . apixaban (ELIQUIS) 5 MG TABS tablet Take 1 tablet (5 mg total) by mouth 2 (two) times daily.  Marland Kitchen Apremilast (OTEZLA) 30 MG TABS Take 1 tablet (30 mg total) by mouth 2 (two) times daily.  . betamethasone dipropionate (DIPROLENE) 0.05 % ointment Apply topically 2 (two) times daily.   . Calcipotriene (SORILUX) 0.005 % FOAM Apply topically. Apply as needed on weekly on Saturday  . cetirizine (ZYRTEC) 10 MG tablet Take 10 mg by mouth daily.  . Cholecalciferol (VITAMIN D) 2000 units CAPS Take by mouth.  . clidinium-chlordiazePOXIDE (LIBRAX) 5-2.5 MG capsule Take 1 capsule by mouth 2 (two) times daily as needed.  . DULoxetine (CYMBALTA) 60 MG capsule Take 1 capsule (60 mg total) by mouth daily.  Marland Kitchen estradiol (VIVELLE-DOT) 0.1 MG/24HR patch Place 1 patch onto the skin 2 (two) times a week.   . fluticasone (FLONASE) 50 MCG/ACT nasal spray USE 2 SPRAYS IN THE NOSE DAILY AS DIRECTED (Patient taking differently: as needed. )  . folic acid (FOLVITE) 1 MG tablet TAKE 1  TABLET DAILY  . levothyroxine (SYNTHROID) 150 MCG tablet Take 1 tablet (150 mcg total) by mouth daily before breakfast.  . RABEprazole (ACIPHEX) 20 MG tablet TAKE 1 TABLET TWICE A DAY  . topiramate (TOPAMAX) 50 MG tablet TAKE 1 TABLET DAILY  .  Apremilast (OTEZLA) 30 MG TABS Take 1 tablet (30 mg total) by mouth 2 (two) times daily. (Patient not taking: Reported on 12/26/2019)   No facility-administered medications prior to visit.    Review of Systems  Psychiatric/Behavioral: The patient is nervous/anxious.   All other systems reviewed and are negative.      Objective    BP (!) 112/53 (BP Location: Left Arm, Patient Position: Sitting, Cuff Size: Large)   Pulse 81   Temp 98.6 F (37 C) (Oral)   Resp 16   Ht 5\' 9"  (1.753 m)   Wt 178 lb (80.7 kg)   SpO2 100%   BMI 26.29 kg/m  Wt Readings from Last 3 Encounters:  12/27/19 173 lb (78.5 kg)  12/26/19 178 lb (80.7 kg)  11/24/19 176 lb (79.8 kg)      Physical Exam Constitutional:      Appearance: Normal appearance. She is normal weight.  HENT:     Head: Normocephalic and atraumatic.     Right Ear: Tympanic membrane, ear canal and external ear normal.     Left Ear: Tympanic membrane, ear canal and external ear normal.     Nose: Nose normal.     Mouth/Throat:     Mouth: Mucous membranes are moist.     Pharynx: Oropharynx is clear.  Eyes:     Extraocular Movements: Extraocular movements intact.     Conjunctiva/sclera: Conjunctivae normal.     Pupils: Pupils are equal, round, and reactive to light.  Cardiovascular:     Rate and Rhythm: Normal rate and regular rhythm.     Pulses: Normal pulses.     Heart sounds: Normal heart sounds.  Pulmonary:     Effort: Pulmonary effort is normal.     Breath sounds: Normal breath sounds.  Abdominal:     General: Abdomen is flat. Bowel sounds are normal.     Palpations: Abdomen is soft.  Musculoskeletal:        General: Normal range of motion.     Cervical back: Normal range of motion  and neck supple.  Skin:    General: Skin is warm and dry.  Neurological:     General: No focal deficit present.     Mental Status: She is alert and oriented to person, place, and time. Mental status is at baseline.  Psychiatric:        Mood and Affect: Mood normal.        Behavior: Behavior normal.        Thought Content: Thought content normal.        Judgment: Judgment normal.       Last depression screening scores PHQ 2/9 Scores 12/26/2019 12/05/2018 11/17/2017  PHQ - 2 Score 0 0 0  PHQ- 9 Score 0 - 0   Last fall risk screening Fall Risk  12/05/2018  Falls in the past year? 0  Number falls in past yr: 0  Injury with Fall? 0   Last Audit-C alcohol use screening Alcohol Use Disorder Test (AUDIT) 12/26/2019  1. How often do you have a drink containing alcohol? 1  2. How many drinks containing alcohol do you have on a typical day when you are drinking? 0  3. How often do you have six or more drinks on one occasion? 0  AUDIT-C Score 1  Alcohol Brief Interventions/Follow-up AUDIT Score <7 follow-up not indicated   A score of 3 or more in women, and 4 or more in men indicates increased risk for alcohol abuse,  EXCEPT if all of the points are from question 1   Results for orders placed or performed in visit on 12/26/19  POCT urinalysis dipstick  Result Value Ref Range   Color, UA Yellow    Clarity, UA Clear    Glucose, UA Negative Negative   Bilirubin, UA Negative    Ketones, UA Negative    Spec Grav, UA 1.010 1.010 - 1.025   Blood, UA Negative    pH, UA 6.0 5.0 - 8.0   Protein, UA Negative Negative   Urobilinogen, UA 0.2 0.2 or 1.0 E.U./dL   Nitrite, UA Negative    Leukocytes, UA Negative Negative   Appearance Normal    Odor Normal     Assessment & Plan    Routine Health Maintenance and Physical Exam  Exercise Activities and Dietary recommendations Goals   None     Immunization History  Administered Date(s) Administered  . Influenza, Seasonal, Injecte,  Preservative Fre 11/26/2016  . Influenza,inj,Quad PF,6+ Mos 12/06/2017, 12/05/2018  . Td 05/14/2017  . Tdap 05/05/2006    Health Maintenance  Topic Date Due  . HIV Screening  Never done  . COLONOSCOPY  10/25/2017  . MAMMOGRAM  12/08/2017  . PAP SMEAR-Modifier  12/09/2018  . INFLUENZA VACCINE  09/24/2019  . TETANUS/TDAP  05/15/2027  . Hepatitis C Screening  Completed    Discussed health benefits of physical activity, and encouraged her to engage in regular exercise appropriate for her age and condition. Patient walks 2 miles per day now.  1. Annual physical exam Sees Dr. Nori Riis for more than 3 decades for well woman care. Follow-up in 1 year. - CBC with Differential/Platelet - Comprehensive metabolic panel - Lipid Panel With LDL/HDL Ratio  2. Screening for blood or protein in urine  - POCT urinalysis dipstick--Norma   3. Need for influenza vaccination  - Flu Vaccine QUAD 36+ mos IM  4. Need for shingles vaccine  - Varicella-zoster vaccine IM (Shingrix)  5. 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 Coagulation will be handled by vascular surgery.   Return in about 1 year (around 12/25/2020).        Arianis Bowditch Cranford Mon, MD  [facility] [phone] (phone) [phone] (fax)  [place]

## 2019-12-27 ENCOUNTER — Emergency Department: Payer: BC Managed Care – PPO

## 2019-12-27 ENCOUNTER — Telehealth (INDEPENDENT_AMBULATORY_CARE_PROVIDER_SITE_OTHER): Payer: Self-pay

## 2019-12-27 ENCOUNTER — Other Ambulatory Visit: Payer: Self-pay

## 2019-12-27 ENCOUNTER — Emergency Department
Admission: EM | Admit: 2019-12-27 | Discharge: 2019-12-27 | Disposition: A | Discharge status: Home / Self Care | Payer: BC Managed Care – PPO | Attending: Emergency Medicine | Admitting: Emergency Medicine

## 2019-12-27 ENCOUNTER — Encounter: Payer: Self-pay | Admitting: Emergency Medicine

## 2019-12-27 DIAGNOSIS — S0003XA Contusion of scalp, initial encounter: Secondary | ICD-10-CM | POA: Diagnosis not present

## 2019-12-27 DIAGNOSIS — W19XXXA Unspecified fall, initial encounter: Secondary | ICD-10-CM

## 2019-12-27 DIAGNOSIS — S7001XA Contusion of right hip, initial encounter: Secondary | ICD-10-CM | POA: Diagnosis not present

## 2019-12-27 DIAGNOSIS — Y92009 Unspecified place in unspecified non-institutional (private) residence as the place of occurrence of the external cause: Secondary | ICD-10-CM

## 2019-12-27 DIAGNOSIS — E039 Hypothyroidism, unspecified: Secondary | ICD-10-CM | POA: Insufficient documentation

## 2019-12-27 DIAGNOSIS — Z85828 Personal history of other malignant neoplasm of skin: Secondary | ICD-10-CM | POA: Insufficient documentation

## 2019-12-27 DIAGNOSIS — Z7901 Long term (current) use of anticoagulants: Secondary | ICD-10-CM | POA: Diagnosis not present

## 2019-12-27 DIAGNOSIS — S0990XA Unspecified injury of head, initial encounter: Secondary | ICD-10-CM | POA: Diagnosis present

## 2019-12-27 DIAGNOSIS — Z79899 Other long term (current) drug therapy: Secondary | ICD-10-CM | POA: Diagnosis not present

## 2019-12-27 DIAGNOSIS — W108XXA Fall (on) (from) other stairs and steps, initial encounter: Secondary | ICD-10-CM | POA: Insufficient documentation

## 2019-12-27 DIAGNOSIS — N189 Chronic kidney disease, unspecified: Secondary | ICD-10-CM | POA: Insufficient documentation

## 2019-12-27 MED ORDER — ORPHENADRINE CITRATE 30 MG/ML IJ SOLN
60.0000 mg | Freq: Two times a day (BID) | INTRAMUSCULAR | Status: DC
  Administered 2019-12-27: 60 mg via INTRAMUSCULAR
  Filled 2019-12-27 (×2): qty 2

## 2019-12-27 MED ORDER — ORPHENADRINE CITRATE ER 100 MG PO TB12: 100.0000 mg | ORAL_TABLET | Freq: Two times a day (BID) | ORAL | 0 refills | Status: DC

## 2019-12-27 MED ORDER — LIDOCAINE 5 % EX PTCH
1.0000 | MEDICATED_PATCH | CUTANEOUS | Status: DC
  Administered 2019-12-27: 1 via TRANSDERMAL
  Filled 2019-12-27: qty 1

## 2019-12-27 MED ORDER — LIDOCAINE 5 % EX PTCH: 1.0000 | MEDICATED_PATCH | Freq: Two times a day (BID) | CUTANEOUS | 0 refills | Status: DC

## 2019-12-27 NOTE — ED Triage Notes (Signed)
Pt comes into the ED via POV c/o fall down 10 stairs.  Pt states that she did hit her head but denies any LOC.  Pt is currently on blood thinners (Eliquis).  Pt currently neurologically intact.  Pt states she also has pain in her right buttock.

## 2019-12-27 NOTE — Discharge Instructions (Signed)
Follow discharge care instruction take medication as directed. °

## 2019-12-27 NOTE — Telephone Encounter (Signed)
Patient called stating that she had fell down 12 steps and hit her head. The patient is not bleeding but she is having a headache. The patient informed that she is on Eliquis and she is concerned on what she should do. Patient was advise to go to the ER to evaluated. Patient ask was it fine for her to drive and she was advise to have someone drive or call EMS.

## 2019-12-27 NOTE — ED Provider Notes (Signed)
[facility] Emergency Department Provider Note   ____________________________________________   First MD Initiated Contact with Patient 12/27/19 1154     (approximate)  I have reviewed the triage vital signs and the nursing notes.   HISTORY  Chief Complaint Fall    HPI Crystal Russo is a 59 y.o. female patient complain of right hip pain secondary to fall.  Patient states she lost her balance coming down steps and fell down 10 flights of stairs.  Patient states she hit her head against the wall but denies LOC.  Patient that she first landed on her right hip which is now becoming more painful.  Patient was concerned because she is taking Eliquis.  Patient denies any radicular component to her back patient's been able to void since incident.   Patient denies headache, vision change, vertigo.  Patient rates her hip pain to 7/10.  Described pain as "achy".  No palliative measures prior to arrival by POV.      Past Medical History:  Diagnosis Date  . Cancer ([place])    skin cancer  . Chronic kidney disease    improved post discontinuing NSAIDS  . Hx of dysplastic nevus 10/13/2012   L proximal ant thigh, mild atypia  . Hx of dysplastic nevus 10/13/2012   L dorsum foot, mild atypia  . Hx of dysplastic nevus 10/22/2014   R ant lat ankle, mild atypia  . Hypothyroidism   . IBS (irritable bowel syndrome)   . Mitral valve prolapse     Patient Active Problem List   Diagnosis Date Noted  . Leg DVT (deep venous thromboembolism), acute, left ([place]) 11/17/2019  . Foot fracture, right, sequela 10/25/2017  . Hemorrhoid 05/17/2015  . 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 History of migraine headaches 05/17/2015  . HLD (hyperlipidemia) 05/17/2015  . Adult hypothyroidism 05/17/2015  . 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 Mild major depression ([place]) 05/17/2015  . Multinodular goiter 05/17/2015  . 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 Allergic rhinitis 11/08/2014  .  Concussion injury of body structure 11/08/2014  . Anxiety, generalized 11/08/2014  . Acid reflux 11/08/2014    Past Surgical History:  Procedure Laterality Date  . ABDOMINAL HYSTERECTOMY    . APPENDECTOMY    . BREAST SURGERY     left nipple biopsy  . MANDIBLE SURGERY    . PERIPHERAL VASCULAR THROMBECTOMY Left 11/02/2019   Procedure: PERIPHERAL VASCULAR THROMBECTOMY / THROMBOLYSIS;  Surgeon: Algernon Huxley, MD;  Location: [facility];  Service: Cardiovascular;  Laterality: Left;  . SHOULDER SURGERY    . TUBAL LIGATION    . UPPER GI ENDOSCOPY  10/26/07   reflux esophagitis and a single gastric polyp    Prior to Admission medications   Medication Sig Start Date End Date Taking? Authorizing Provider  apixaban (ELIQUIS) 5 MG TABS tablet Take 1 tablet (5 mg total) by mouth 2 (two) times daily. 11/24/19   Kris Hartmann, NP  Apremilast (OTEZLA) 30 MG TABS Take 1 tablet (30 mg total) by mouth 2 (two) times daily. 08/16/19   Brendolyn Patty, MD  Apremilast (OTEZLA) 30 MG TABS Take 1 tablet (30 mg total) by mouth 2 (two) times daily. Patient not taking: Reported on 12/26/2019 12/20/19   Laurence Ferrari, [place], MD  betamethasone dipropionate (DIPROLENE) 0.05 % ointment Apply topically 2 (two) times daily.     [provider]  Calcipotriene (SORILUX) 0.005 % FOAM Apply topically. Apply as needed on weekly on Saturday    [provider]  cetirizine (ZYRTEC) 10 MG tablet Take 10 mg by mouth daily.    [provider]  Cholecalciferol (VITAMIN D) 2000 units CAPS Take by mouth. 01/05/11   [provider]  clidinium-chlordiazePOXIDE (LIBRAX) 5-2.5 MG capsule Take 1 capsule by mouth 2 (two) times daily as needed. 04/11/19   Jerrol Banana., MD  DULoxetine (CYMBALTA) 60 MG capsule Take 1 capsule (60 mg total) by mouth daily. 04/03/19   Jerrol Banana., MD  estradiol (VIVELLE-DOT) 0.1 MG/24HR patch Place 1 patch onto the skin 2 (two) times a week.  08/31/16   [provider]  fluticasone (FLONASE) 50 MCG/ACT nasal spray USE 2 SPRAYS IN THE NOSE DAILY AS DIRECTED Patient taking differently: as needed.  11/09/14   Jerrol Banana., MD  folic acid (FOLVITE) 1 MG tablet TAKE 1 TABLET DAILY 12/06/19   Jerrol Banana., MD  levothyroxine (SYNTHROID) 150 MCG tablet Take 1 tablet (150 mcg total) by mouth daily before breakfast. 04/07/19   Jerrol Banana., MD  RABEprazole (ACIPHEX) 20 MG tablet TAKE 1 TABLET TWICE A DAY 12/06/19   Jerrol Banana., MD  topiramate (TOPAMAX) 50 MG tablet TAKE 1 TABLET DAILY 12/06/19   Jerrol Banana., MD    Allergies Nsaids and Other  Family History  Problem Relation Age of Onset  . Hypertension Mother   . Heart disease Mother   . Arthritis Mother   . Graves' disease Mother   . Thyroid cancer Mother   . Fibroids Mother   . Diverticulosis Mother   . Basal cell carcinoma Mother   . Ulcers Father   . Hyperthyroidism Father   . Arthritis Father   . Congestive Heart Failure Father   . Diverticulosis Father   . Lung cancer Father   . Fibroids Sister   . Hypertension Sister   . Fibromyalgia Sister   . Heart disease Sister   . Lupus Sister   . Hypertension Brother   . Hyperlipidemia Brother   . Skin cancer Brother   . Congenital heart disease Brother   . Stroke Brother   . Hypertension Brother   . Breast cancer Maternal Aunt   . Stroke Maternal Grandmother   . Stroke Maternal Grandfather   . Liver cancer Paternal Grandfather     Social History Social History   Tobacco Use  . Smoking status: Never Smoker  . Smokeless tobacco: Never Used  Vaping Use  . Vaping Use: Never used  Substance Use Topics  . Alcohol use: Yes    Comment: once a month  . Drug use: No    Review of Systems Constitutional: No fever/chills Eyes: No visual changes. ENT: No sore throat. Cardiovascular: Denies chest pain.  Mitral valve prolapse. Respiratory: Denies shortness of  breath. Gastrointestinal: No abdominal pain.  No nausea, no vomiting.  No diarrhea.  No constipation. Genitourinary: Negative for dysuria. Musculoskeletal: Right hip pain. Skin: Negative for rash. Neurological: Negative for headaches, focal weakness or numbness. Psychiatric:  Anxiety Endocrine:  Hypothyroidism Allergic/Immunilogical: NSAIDs ____________________________________________   PHYSICAL EXAM:  VITAL SIGNS: ED Triage Vitals [12/27/19 1017]  Enc Vitals Group     BP 133/68     Pulse Rate 81     Resp 18     Temp 98.5 F (36.9 C)     Temp Source Oral     SpO2 100 %     Weight 173 lb (78.5 kg)     Height  5\' 9"  (1.753 m)     Head Circumference      Peak Flow      Pain Score 7     Pain Loc      Pain Edu?      Excl. in [place]?     Constitutional: Alert and oriented. Well appearing and in no acute distress. Eyes: Conjunctivae are normal. PERRL. EOMI. Head: Atraumatic. Nose: No congestion/rhinnorhea. Mouth/Throat: Mucous membranes are moist.  Oropharynx non-erythematous. Neck: No stridor.  No cervical spine tenderness to palpation. Hematological/Lymphatic/Immunilogical: No cervical lymphadenopathy. Cardiovascular: Normal rate, regular rhythm. Grossly normal heart sounds.  Good peripheral circulation. Respiratory: Normal respiratory effort.  No retractions. Lungs CTAB. Gastrointestinal: Soft and nontender. No distention. No abdominal bruits. No CVA tenderness. Genitourinary: Deferred Musculoskeletal: No lower extremity tenderness nor edema.  No joint effusions. Neurologic:  Normal speech and language. No gross focal neurologic deficits are appreciated. No gait instability. Skin:  Skin is warm, dry and intact. No rash noted.  Abrasion right lower leg. No ecchymosis. Psychiatric: Mood and affect are normal. Speech and behavior are normal.  ____________________________________________   LABS (all labs ordered are listed, but only abnormal results are displayed)  Labs  Reviewed - No data to display ____________________________________________  EKG   ____________________________________________  RADIOLOGY I, Sable Feil, personally viewed and evaluated these images (plain radiographs) as part of my medical decision making, as well as reviewing the written report by the radiologist.  ED MD interpretation: No acute findings on x-ray of the right hip.  Official radiology report(s): CT HEAD WO CONTRAST  Result Date: 12/27/2019 CLINICAL DATA:  Facial trauma. Patient fell down stairs. Denies loss of consciousness. Clinically patient is neurologically intact. EXAM: CT HEAD WITHOUT CONTRAST TECHNIQUE: Contiguous axial images were obtained from the base of the skull through the vertex without intravenous contrast. COMPARISON:  None FINDINGS: Brain: No evidence of acute infarction, hemorrhage, hydrocephalus, extra-axial collection or mass lesion/mass effect. Vascular: No hyperdense vessel or unexpected calcification. Skull: Normal. Negative for fracture or focal lesion. Sinuses/Orbits: No acute finding. Other: None. IMPRESSION: No acute intracranial abnormalities. Normal brain. Electronically Signed   By: Kerby Moors M.D.   On: 12/27/2019 10:46    ____________________________________________   PROCEDURES  Procedure(s) performed (including Critical Care):  Procedures   ____________________________________________   INITIAL IMPRESSION / ASSESSMENT AND PLAN / ED COURSE  As part of my medical decision making, I reviewed the following data within the [place]         Patient presents with right hip/right buttocks pain secondary to a fall. Patient also states she hit the left side of her head but denies LOC. Patient appears no acute distress and alert and orientated throughout the exam. Discussed no acute findings on CT of the head and x-ray of the right hip. Patient complaining physical exam consistent with contusion secondary to  fall. Patient given discharge care instruction advised take medication as directed. Patient vies follow-up home station PCP as needed.     ____________________________________________   FINAL CLINICAL IMPRESSION(S) / ED DIAGNOSES  Final diagnoses:  None     ED Discharge Orders    None      *Please note:  ZORIAH PULICE was evaluated in Emergency Department on 12/27/2019 for the symptoms described in the history of present illness. She was evaluated in the context of the global COVID-19 pandemic, which necessitated consideration that the patient might be at risk for infection with the SARS-CoV-2 virus that causes COVID-19. Institutional protocols  and algorithms that pertain to the evaluation of patients at risk for COVID-19 are in a state of rapid change based on information released by regulatory bodies including the CDC and federal and state organizations. These policies and algorithms were followed during the patient's care in the ED.  Some ED evaluations and interventions may be delayed as a result of limited staffing during and the pandemic.*   Note:  This document was prepared using Dragon voice recognition software and may include unintentional dictation errors.    Sable Feil, PA-C 12/27/19 1252    Blake Divine, MD 12/28/19 1719

## 2020-02-20 ENCOUNTER — Telehealth: Payer: Self-pay

## 2020-02-20 NOTE — Telephone Encounter (Addendum)
Patient advised when she is due for 2nd shingles vaccine. Patient wanted Dr. Sullivan Lone to know she will get her labs done soon. Patient also wanted to know if it is safe for her to take OTC Hair and nail gummies that has of biotin in it, with her current medications. Please advise? Patient is aware Dr. Sullivan Lone is out of the office until 02/26/2019.

## 2020-02-20 NOTE — Telephone Encounter (Signed)
Copied from CRM [id number]. Topic: General - Other >> Feb 20, 2020 11:59 AM Jaquita Rector A wrote: Reason for CRM: Patient called in to inquire of when she is due for her second Shingles vaccine 12/26/19 was first shingles vaccine. Also need to know if its ok to take shingles and Covid Booster at the same if not what is the wait period? Want to know if she can take OTC Hair and nail gummies that has of biotin in it. Please call Ph# [phone]

## 2020-02-21 NOTE — Telephone Encounter (Signed)
Patient was advised.  

## 2020-02-21 NOTE — Telephone Encounter (Signed)
yes

## 2020-03-05 ENCOUNTER — Other Ambulatory Visit: Payer: Self-pay | Admitting: Family Medicine

## 2020-03-05 DIAGNOSIS — F320 Major depressive disorder, single episode, mild: Secondary | ICD-10-CM

## 2020-03-07 LAB — CBC WITH DIFFERENTIAL/PLATELET
Basophils Absolute: 0.1 10*3/uL (ref 0.0–0.2)
Basos: 2 %
EOS (ABSOLUTE): 0.2 10*3/uL (ref 0.0–0.4)
Eos: 4 %
Hematocrit: 36.7 % (ref 34.0–46.6)
Hemoglobin: 12 g/dL (ref 11.1–15.9)
Immature Grans (Abs): 0 10*3/uL (ref 0.0–0.1)
Immature Granulocytes: 0 %
Lymphocytes Absolute: 2.5 10*3/uL (ref 0.7–3.1)
Lymphs: 41 %
MCH: 27.6 pg (ref 26.6–33.0)
MCHC: 32.7 g/dL (ref 31.5–35.7)
MCV: 84 fL (ref 79–97)
Monocytes Absolute: 0.6 10*3/uL (ref 0.1–0.9)
Monocytes: 11 %
Neutrophils Absolute: 2.4 10*3/uL (ref 1.4–7.0)
Neutrophils: 42 %
Platelets: 432 10*3/uL (ref 150–450)
RBC: 4.35 x10E6/uL (ref 3.77–5.28)
RDW: 13.6 % (ref 11.7–15.4)
WBC: 5.8 10*3/uL (ref 3.4–10.8)

## 2020-03-07 LAB — COMPREHENSIVE METABOLIC PANEL
ALT: 15 IU/L (ref 0–32)
AST: 16 IU/L (ref 0–40)
Albumin/Globulin Ratio: 1.6 (ref 1.2–2.2)
Albumin: 4.2 g/dL (ref 3.8–4.9)
Alkaline Phosphatase: 108 IU/L (ref 44–121)
BUN/Creatinine Ratio: 14 (ref 9–23)
BUN: 14 mg/dL (ref 6–24)
Bilirubin Total: <0.2 mg/dL (ref 0.0–1.2)
CO2: 21 mmol/L (ref 20–29)
Calcium: 9.5 mg/dL (ref 8.7–10.2)
Chloride: 105 mmol/L (ref 96–106)
Creatinine, Ser: 0.98 mg/dL (ref 0.57–1.00)
GFR calc Af Amer: 74 mL/min/{1.73_m2} (ref >59)
GFR calc non Af Amer: 64 mL/min/{1.73_m2} (ref >59)
Globulin, Total: 2.7 g/dL (ref 1.5–4.5)
Glucose: 94 mg/dL (ref 65–99)
Potassium: 3.8 mmol/L (ref 3.5–5.2)
Sodium: 142 mmol/L (ref 134–144)
Total Protein: 6.9 g/dL (ref 6.0–8.5)

## 2020-03-07 LAB — LIPID PANEL WITH LDL/HDL RATIO
Cholesterol, Total: 232 mg/dL — ABNORMAL HIGH (ref 100–199)
HDL: 70 mg/dL (ref >39)
LDL Chol Calc (NIH): 148 mg/dL — ABNORMAL HIGH (ref 0–99)
LDL/HDL Ratio: 2.1 ratio (ref 0.0–3.2)
Triglycerides: 82 mg/dL (ref 0–149)
VLDL Cholesterol Cal: 14 mg/dL (ref 5–40)

## 2020-03-13 ENCOUNTER — Other Ambulatory Visit: Payer: Self-pay | Admitting: Family Medicine

## 2020-03-13 DIAGNOSIS — K589 Irritable bowel syndrome without diarrhea: Secondary | ICD-10-CM

## 2020-03-13 NOTE — Telephone Encounter (Signed)
Requested medication (s) are due for refill today: due 04/10/20  Requested medication (s) are on the active medication list: yes   Last refill: 04/11/19  #180  3 refills  Future visit scheduled yes 12/26/20  Notes to clinic:* not delegated Requested Prescriptions  Pending Prescriptions Disp Refills   clidinium-chlordiazePOXIDE (LIBRAX) 5-2.5 MG capsule [Pharmacy Med Name: CHLORDIAZEP/CLIDIN(LIBRAX) CP 5/2.5] 180 capsule 3    Sig: TAKE 1 CAPSULE TWICE A DAY AS NEEDED      Not Delegated - Psychiatry:  Anxiolytics/Hypnotics Failed - 03/13/2020 12:56 AM      Failed - This refill cannot be delegated      Failed - Urine Drug Screen completed in last 360 days      Passed - Valid encounter within last 6 months    Recent Outpatient Visits           2 months ago Annual physical exam   [facility] Jerrol Banana., MD   4 months ago DVT of deep femoral vein, left [facility])   [facility] Jerrol Banana., MD   1 year ago Annual physical exam   [facility] Jerrol Banana., MD   2 years ago Annual physical exam   [facility] Jerrol Banana., MD   2 years ago Adult hypothyroidism   [facility] Jerrol Banana., MD       Future Appointments             In 8 months [facility], [place], MD [place]   In 9 months Jerrol Banana., MD [facility], [place]

## 2020-05-24 ENCOUNTER — Ambulatory Visit (INDEPENDENT_AMBULATORY_CARE_PROVIDER_SITE_OTHER): Payer: BLUE CROSS/BLUE SHIELD | Admitting: Nurse Practitioner

## 2020-05-31 ENCOUNTER — Other Ambulatory Visit: Payer: Self-pay

## 2020-05-31 ENCOUNTER — Ambulatory Visit (INDEPENDENT_AMBULATORY_CARE_PROVIDER_SITE_OTHER): Payer: BC Managed Care – PPO | Admitting: Nurse Practitioner

## 2020-05-31 VITALS — BP 124/78 | HR 101 | Ht 68.0 in | Wt 173.0 lb

## 2020-05-31 DIAGNOSIS — I871 Compression of vein: Secondary | ICD-10-CM | POA: Diagnosis not present

## 2020-05-31 DIAGNOSIS — E785 Hyperlipidemia, unspecified: Secondary | ICD-10-CM | POA: Diagnosis not present

## 2020-05-31 MED ORDER — APIXABAN 5 MG PO TABS
5.0000 mg | ORAL_TABLET | Freq: Two times a day (BID) | ORAL | 3 refills | Status: DC
Start: 2020-05-31 — End: 2021-05-05

## 2020-06-02 ENCOUNTER — Encounter (INDEPENDENT_AMBULATORY_CARE_PROVIDER_SITE_OTHER): Payer: Self-pay | Admitting: Nurse Practitioner

## 2020-06-02 NOTE — Progress Notes (Signed)
Subjective:    Patient ID: Crystal Russo, female    DOB: Aug 09, 1960, 60 y.o.   MRN: [id number] Chief Complaint  Patient presents with  . Follow-up    6 Mo no studies    Crystal Russo is a 59 year old woman that presents today for follow-up evaluation of left lower extremity DVT.  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  Since that time the patient has been wearing medical grade 1 compression stockings diligently and has no issues with swelling currently.  She has been compliant with Eliquis as well.  She denies any swelling or pain in such a way that she felt when she had her DVT.  Overall she has been doing well.   Review of Systems  Cardiovascular: Negative for leg swelling.  All other systems reviewed and are negative.      Objective:   Physical Exam Vitals reviewed.  HENT:     Head: Normocephalic.  Cardiovascular:     Rate and Rhythm: Normal rate.  Pulmonary:     Effort: Pulmonary effort is normal.  Musculoskeletal:     Right lower leg: No edema.     Left lower leg: No edema.  Neurological:     Mental Status: She is alert and oriented to person, place, and time.  Psychiatric:        Mood and Affect: Mood normal.        Behavior: Behavior normal.        Thought Content: Thought content normal.        Judgment: Judgment normal.     BP 124/78   Pulse (!) 101   Ht 5\' 8"  (1.727 m)   Wt 173 lb (78.5 kg)   BMI 26.30 kg/m   Past Medical History:  Diagnosis Date  . Cancer ([place])    skin cancer  . Chronic kidney disease    improved post discontinuing NSAIDS  . Hx of dysplastic nevus 10/13/2012   L proximal ant thigh, mild atypia  . Hx of dysplastic nevus 10/13/2012   L dorsum foot, mild atypia  . Hx of dysplastic nevus 10/22/2014   R ant lat ankle, mild atypia  . Hypothyroidism   . IBS (irritable bowel syndrome)   . Mitral valve prolapse     Social History   Socioeconomic History  . Marital status: Married    Spouse name:  Not on file  . Number of children: Not on file  . Years of education: Not on file  . Highest education level: Not on file  Occupational History  . Not on file  Tobacco Use  . Smoking status: Never Smoker  . Smokeless tobacco: Never Used  Vaping Use  . Vaping Use: Never used  Substance and Sexual Activity  . Alcohol use: Yes    Comment: once a month  . Drug use: No  . Sexual activity: Yes    Birth control/protection: None  Other Topics Concern  . Not on file  Social History Narrative  . Not on file   Social Determinants of Health   Financial Resource Strain: Not on file  Food Insecurity: Not on file  Transportation Needs: Not on file  Physical Activity: Not on file  Stress: Not on file  Social Connections: Not on file  Intimate Partner Violence: Not on file    Past Surgical History:  Procedure Laterality Date  . ABDOMINAL HYSTERECTOMY    . APPENDECTOMY    . BREAST SURGERY  left nipple biopsy  . MANDIBLE SURGERY    . PERIPHERAL VASCULAR THROMBECTOMY Left 11/02/2019   Procedure: PERIPHERAL VASCULAR THROMBECTOMY / THROMBOLYSIS;  Surgeon: Algernon Huxley, MD;  Location: [facility];  Service: Cardiovascular;  Laterality: Left;  . SHOULDER SURGERY    . TUBAL LIGATION    . UPPER GI ENDOSCOPY  10/26/07   reflux esophagitis and a single gastric polyp    Family History  Problem Relation Age of Onset  . Hypertension Mother   . Heart disease Mother   . Arthritis Mother   . Graves' disease Mother   . Thyroid cancer Mother   . Fibroids Mother   . Diverticulosis Mother   . Basal cell carcinoma Mother   . Ulcers Father   . Hyperthyroidism Father   . Arthritis Father   . Congestive Heart Failure Father   . Diverticulosis Father   . Lung cancer Father   . Fibroids Sister   . Hypertension Sister   . Fibromyalgia Sister   . Heart disease Sister   . Lupus Sister   . Hypertension Brother   . Hyperlipidemia Brother   . Skin cancer Brother   . Congenital heart  disease Brother   . Stroke Brother   . Hypertension Brother   . Breast cancer Maternal Aunt   . Stroke Maternal Grandmother   . Stroke Maternal Grandfather   . Liver cancer Paternal Grandfather     Allergies  Allergen Reactions  . Nsaids   . Other     CBC Latest Ref Rng & Units 03/06/2020 12/07/2018 11/15/2017  WBC 3.4 - 10.8 x10E3/uL 5.8 4.2 6.2  Hemoglobin 11.1 - 15.9 g/dL 12.0 12.9 12.7  Hematocrit 34.0 - 46.6 % 36.7 38.5 38.9  Platelets 150 - 450 x10E3/uL 432 345 407      CMP     Component Value Date/Time   NA 142 03/06/2020 1026   K 3.8 03/06/2020 1026   CL 105 03/06/2020 1026   CO2 21 03/06/2020 1026   GLUCOSE 94 03/06/2020 1026   BUN 14 03/06/2020 1026   CREATININE 0.98 03/06/2020 1026   CALCIUM 9.5 03/06/2020 1026   PROT 6.9 03/06/2020 1026   ALBUMIN 4.2 03/06/2020 1026   AST 16 03/06/2020 1026   ALT 15 03/06/2020 1026   ALKPHOS 108 03/06/2020 1026   BILITOT <0.2 03/06/2020 1026   GFRNONAA 64 03/06/2020 1026   GFRAA 74 03/06/2020 1026     No results found.     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  The patient and I had a discussion about Eliquis and continuation.  Currently the patient is doing well on full dose anticoagulation and given that her DVT was caused by May Thurner there is concern for possible recurrence.  Based on this we will have the patient remain on anticoagulation as currently prescribed.  The patient is advised to continue with her conservative therapies including elevating her lower extremities, elevation of her legs and exercise.  Patient will follow up on an annual basis or sooner if issues arise.  2. Hyperlipidemia, unspecified hyperlipidemia type Continue statin as ordered and reviewed, no changes at this time     Current Outpatient Medications on File Prior to Visit  Medication Sig Dispense Refill  . Apremilast (OTEZLA) 30 MG TABS Take 1 tablet (30 mg  total) by mouth 2 (two) times daily. 28 tablet 12  . Apremilast (OTEZLA) 30 MG TABS  Take 1 tablet (30 mg total) by mouth 2 (two) times daily. 60 tablet 5  . betamethasone dipropionate (DIPROLENE) 0.05 % ointment Apply topically 2 (two) times daily.     . Calcipotriene 0.005 % FOAM Apply topically. Apply as needed on weekly on Saturday    . cetirizine (ZYRTEC) 10 MG tablet Take 10 mg by mouth daily.    . Cholecalciferol (VITAMIN D) 2000 units CAPS Take by mouth.    . clidinium-chlordiazePOXIDE (LIBRAX) 5-2.5 MG capsule TAKE 1 CAPSULE TWICE A DAY AS NEEDED 180 capsule 3  . DULoxetine (CYMBALTA) 60 MG capsule TAKE 1 CAPSULE DAILY 90 capsule 0  . estradiol (VIVELLE-DOT) 0.1 MG/24HR patch Place 1 patch onto the skin 2 (two) times a week.   12  . fluticasone (FLONASE) 50 MCG/ACT nasal spray USE 2 SPRAYS IN THE NOSE DAILY AS DIRECTED (Patient taking differently: as needed.) 16 g 5  . folic acid (FOLVITE) 1 MG tablet TAKE 1 TABLET DAILY 90 tablet 3  . levothyroxine (SYNTHROID) 150 MCG tablet Take 1 tablet (150 mcg total) by mouth daily before breakfast. 90 tablet 3  . lidocaine (LIDODERM) 5 % Place 1 patch onto the skin every 12 (twelve) hours. Remove & Discard patch within 12 hours or as directed by MD 10 patch 0  . orphenadrine (NORFLEX) 100 MG tablet Take 1 tablet (100 mg total) by mouth 2 (two) times daily. 10 tablet 0  . RABEprazole (ACIPHEX) 20 MG tablet TAKE 1 TABLET TWICE A DAY 180 tablet 3  . topiramate (TOPAMAX) 50 MG tablet TAKE 1 TABLET DAILY 90 tablet 3   No current facility-administered medications on file prior to visit.    There are no Patient Instructions on file for this visit. No follow-ups on file.   Kris Hartmann, NP

## 2020-06-03 ENCOUNTER — Other Ambulatory Visit: Payer: Self-pay | Admitting: Family Medicine

## 2020-06-03 DIAGNOSIS — F320 Major depressive disorder, single episode, mild: Secondary | ICD-10-CM

## 2020-06-11 ENCOUNTER — Other Ambulatory Visit: Payer: Self-pay | Admitting: Family Medicine

## 2020-06-11 DIAGNOSIS — E039 Hypothyroidism, unspecified: Secondary | ICD-10-CM

## 2020-06-11 NOTE — Telephone Encounter (Signed)
Requested medications are due for refill today yes  Requested medications are on the active medication list yes  Last refill 03/15/20  Last visit 12/26/19  Future visit scheduled 12/26/20  Notes to clinic Failed protocol due to no valid labs within 360 days, last TSH 11/2018.

## 2020-08-21 ENCOUNTER — Encounter: Payer: Self-pay | Admitting: Dermatology

## 2020-08-21 ENCOUNTER — Other Ambulatory Visit: Payer: Self-pay

## 2020-08-21 ENCOUNTER — Ambulatory Visit: Payer: BC Managed Care – PPO | Admitting: Dermatology

## 2020-08-21 DIAGNOSIS — L578 Other skin changes due to chronic exposure to nonionizing radiation: Secondary | ICD-10-CM

## 2020-08-21 DIAGNOSIS — L814 Other melanin hyperpigmentation: Secondary | ICD-10-CM

## 2020-08-21 DIAGNOSIS — L820 Inflamed seborrheic keratosis: Secondary | ICD-10-CM | POA: Diagnosis not present

## 2020-08-21 DIAGNOSIS — L821 Other seborrheic keratosis: Secondary | ICD-10-CM

## 2020-08-21 NOTE — Progress Notes (Signed)
   Follow-Up Visit   Subjective  Crystal Russo is a 60 y.o. female who presents for the following: Skin Problem (Patient here today for a spot at the chest, present for about 2 weeks. The area started out small, was itchy and has gotten larger. Patient also with 2 other spots at face and left thigh. Patient just noticed them recently and wants to make sure they're ok. She does have a hx dysplastic nevi.).  Patient has appt for FBSE in September.   The following portions of the chart were reviewed this encounter and updated as appropriate:   Tobacco  Allergies  Meds  Problems  Med Hx  Surg Hx  Fam Hx       Review of Systems:  No other skin or systemic complaints except as noted in HPI or Assessment and Plan.  Objective  Well appearing patient in no apparent distress; mood and affect are within normal limits.  A focused examination was performed including legs, face, chest. Relevant physical exam findings are noted in the Assessment and Plan.  Right chest Erythematous keratotic or waxy stuck-on papule or plaque.   left chin Stuck-on, waxy, tan-brown papules and plaques -- Discussed benign etiology and prognosis.    Assessment & Plan  Inflamed seborrheic keratosis Right chest  Prior to procedure, discussed risks of blister formation, small wound, skin dyspigmentation, or rare scar following cryotherapy. Recommend Vaseline ointment to treated areas while healing.   Destruction of lesion - Right chest  Destruction method: cryotherapy   Informed consent: discussed and consent obtained   Lesion destroyed using liquid nitrogen: Yes   Cryotherapy cycles:  2 Outcome: patient tolerated procedure well with no complications   Post-procedure details: wound care instructions given    Seborrheic keratosis left chin  Vs Lentigo  Benign-appearing.  Observation.  Call clinic for new or changing lesions.  Recommend daily use of broad spectrum spf 30+ sunscreen to sun-exposed  areas.   Discussed BBL   Lentigines - Scattered tan macules - Due to sun exposure - Benign-appering, observe - Recommend daily broad spectrum sunscreen SPF 30+ to sun-exposed areas, reapply every 2 hours as needed. - Call for any changes  Actinic Damage - chronic, secondary to cumulative UV radiation exposure/sun exposure over time - diffuse scaly erythematous macules with underlying dyspigmentation - Recommend daily broad spectrum sunscreen SPF 30+ to sun-exposed areas, reapply every 2 hours as needed.  - Recommend staying in the shade or wearing long sleeves, sun glasses (UVA+UVB protection) and wide brim hats (4-inch brim around the entire circumference of the hat). - Call for new or changing lesions. - Recommend taking Heliocare sun protection supplement daily as needed in sunny weather for additional sun protection. For maximum protection on the sunniest days, you can take up to 2 capsules of regular Heliocare OR take 1 capsule of Heliocare Ultra. For prolonged exposure (such as a full day in the sun), you can repeat your dose of the supplement 4 hours after your first dose.    Return for TBSE, as scheduled.  Graciella Belton, RMA, am acting as scribe for Forest Gleason, MD .  Documentation: I have reviewed the above documentation for accuracy and completeness, and I agree with the above.  Forest Gleason, MD

## 2020-08-21 NOTE — Patient Instructions (Addendum)
Cryotherapy Aftercare  Wash gently with soap and water everyday.   Apply Vaseline and Band-Aid daily until healed.   Prior to procedure, discussed risks of blister formation, small wound, skin dyspigmentation, or rare scar following cryotherapy. Recommend Vaseline ointment to treated areas while healing.  Recommend daily broad spectrum sunscreen SPF 30+ to sun-exposed areas, reapply every 2 hours as needed. Call for new or changing lesions.  Staying in the shade or wearing long sleeves, sun glasses (UVA+UVB protection) and wide brim hats (4-inch brim around the entire circumference of the hat) are also recommended for sun protection.   Recommend taking Heliocare sun protection supplement daily in sunny weather for additional sun protection. For maximum protection on the sunniest days, you can take up to 2 capsules of regular Heliocare OR take 1 capsule of Heliocare Ultra. For prolonged exposure (such as a full day in the sun), you can repeat your dose of the supplement 4 hours after your first dose. Heliocare can be purchased at [facility] or at [email or web address].    If you have any questions or concerns for your doctor, please call our main line at [phone] and press option 4 to reach your doctor's medical assistant. If no one answers, please leave a voicemail as directed and we will return your call as soon as possible. Messages left after 4 pm will be answered the following business day.   You may also send us a message via MyChart. We typically respond to MyChart messages within 1-2 business days.  For prescription refills, please ask your pharmacy to contact our office. Our fax number is [phone].  If you have an urgent issue when the clinic is closed that cannot wait until the next business day, you can page your doctor at the number below.    Please note that while we do our best to be available for urgent issues outside of office hours, we are not available 24/7.   If  you have an urgent issue and are unable to reach us, you may choose to seek medical care at your doctor's office, retail clinic, urgent care center, or emergency room.  If you have a medical emergency, please immediately call 911 or go to the emergency department.  Pager Numbers  - Dr. Kowalski: [phone]  - Dr. Moye: [phone]  - Dr. Stewart: [phone]  In the event of inclement weather, please call our main line at [phone] for an update on the status of any delays or closures.  Dermatology Medication Tips: Please keep the boxes that topical medications come in in order to help keep track of the instructions about where and how to use these. Pharmacies typically print the medication instructions only on the boxes and not directly on the medication tubes.   If your medication is too expensive, please contact our office at [phone] option 4 or send us a message through MyChart.   We are unable to tell what your co-pay for medications will be in advance as this is different depending on your insurance coverage. However, we may be able to find a substitute medication at lower cost or fill out paperwork to get insurance to cover a needed medication.   If a prior authorization is required to get your medication covered by your insurance company, please allow us 1-2 business days to complete this process.  Drug prices often vary depending on where the prescription is filled and some pharmacies may offer cheaper prices.  The website www.goodrx.com contains coupons for medications through   different pharmacies. The prices here do not account for what the cost may be with help from insurance (it may be cheaper with your insurance), but the website can give you the price if you did not use any insurance.  - You can print the associated coupon and take it with your prescription to the pharmacy.  - You may also stop by our office during regular business hours and pick up a GoodRx  coupon card.  - If you need your prescription sent electronically to a different pharmacy, notify our office through [facility] MyChart or by phone at [phone] option 4.  

## 2020-08-22 ENCOUNTER — Other Ambulatory Visit: Payer: Self-pay | Admitting: Family Medicine

## 2020-08-22 DIAGNOSIS — D649 Anemia, unspecified: Secondary | ICD-10-CM

## 2020-08-22 MED ORDER — FOLIC ACID 1 MG PO TABS: 1.0000 mg | ORAL_TABLET | Freq: Every day | ORAL | 3 refills | Status: DC

## 2020-08-22 NOTE — Telephone Encounter (Signed)
Medication Refill - Medication:   folic acid (FOLVITE) 1 MG tablet  Has the patient contacted their pharmacy? Yes.  Pt is having issues with [facility] and wanted [facility] sent to [facility].    Preferred Pharmacy (with phone number or street name):   [id number] [address], [place]  [place] [address]  Phone: [phone] Fax: [phone]    Agent: Please be advised that RX refills may take up to 3 business days. We ask that you follow-up with your pharmacy.

## 2020-09-02 ENCOUNTER — Other Ambulatory Visit: Payer: Self-pay

## 2020-09-02 DIAGNOSIS — L409 Psoriasis, unspecified: Secondary | ICD-10-CM

## 2020-09-02 MED ORDER — OTEZLA 30 MG PO TABS: 30.0000 mg | ORAL_TABLET | Freq: Two times a day (BID) | ORAL | 2 refills | Status: DC

## 2020-11-14 ENCOUNTER — Ambulatory Visit: Payer: BC Managed Care – PPO | Admitting: Dermatology

## 2020-12-02 ENCOUNTER — Other Ambulatory Visit: Payer: Self-pay | Admitting: Family Medicine

## 2020-12-02 DIAGNOSIS — F320 Major depressive disorder, single episode, mild: Secondary | ICD-10-CM

## 2020-12-02 DIAGNOSIS — K219 Gastro-esophageal reflux disease without esophagitis: Secondary | ICD-10-CM

## 2020-12-02 DIAGNOSIS — Z8669 Personal history of other diseases of the nervous system and sense organs: Secondary | ICD-10-CM

## 2020-12-02 DIAGNOSIS — D649 Anemia, unspecified: Secondary | ICD-10-CM

## 2020-12-02 NOTE — Telephone Encounter (Signed)
Requested medication (s) are due for refill today - yes  Requested medication (s) are on the active medication list -yes  Future visit scheduled -yes  Last refill: 12/06/19 #90 3RF non delegated Rx  Requested Prescriptions  Pending Prescriptions Disp Refills   topiramate (TOPAMAX) 50 MG tablet [Pharmacy Med Name: TOPIRAMATE TABS 50MG ] 90 tablet 3    Sig: TAKE 1 TABLET DAILY     Not Delegated - Neurology: Anticonvulsants - topiramate & zonisamide Failed - 12/02/2020 12:59 AM      Failed - This refill cannot be delegated      Passed - Cr in normal range and within 360 days    Creatinine, Ser  Date Value Ref Range Status  03/06/2020 0.98 0.57 - 1.00 mg/dL Final          Passed - CO2 in normal range and within 360 days    CO2  Date Value Ref Range Status  03/06/2020 21 20 - 29 mmol/L Final          Passed - Valid encounter within last 12 months    Recent Outpatient Visits           11 months ago Annual physical exam   [facility]rol [facility]   1 year ago DVT of deep femoral vein, left [facility])   [facility]rol [facility] MD   1 year ago Annual physical exam   [facility] Jerrol Ban[facility]., MD   3 years ago Annual physical exam   [facility]   3 years ago Adult hypothyroidism   [facility] Jerrol Banana., MD       Future Appointments             In 3 weeks Jerrol Banana., MD [facility], [place]   In 2 months [place], [place], MD [place]            Signed Prescriptions Disp Refills   DULoxetine (CYMBALTA) 60 MG capsule 30 capsule 0    Sig: TAKE 1 CAPSULE DAILY     Psychiatry: Antidepressants - SNRI Failed - 12/02/2020 12:59 AM      Failed - Valid encounter within last 6 months    Recent Outpatient Visits           11 months ago Annual physical exam   [facility]   1 year ago DVT of deep femoral vein, left [facility])   [facility] MD   1 year ago Annual physical exam   [facility] Jerrol Banana., MD   3 years ago Annual physical exam   [facility] MD   3 years ago Adult hypothyroidism   [facility] Jerrol Banana., MD       Future Appointments             In 3 weeks Jerrol Banana., MD [facility], [place]   In 2 months [place], [place], MD [place] - Completed PHQ-2 or PHQ-9 in the last 360 days      Passed - Last BP in normal range    BP Readings from Last 1 Encounters:  05/31/20 124/78  folic acid (FOLVITE) 1 MG tablet 90 tablet 2    Sig: TAKE 1 TABLET DAILY     Endocrinology:  Vitamins Passed - 12/02/2020 12:59 AM      Passed - Valid encounter within last 12 months    Recent Outpatient Visits           11 months ago Annual physical exam   [facility]   1 year ago DVT of deep femoral vein, left [facility])   [facility] Jerrol Ban[facility] MD   1 year ago Annual physical exam   [facility]   3 years ago Annual physical exam   [facility] Jerrol Banana., MD   3 years ago Adult hypothyroidism   [facility] Jer[facility] MD       Future Appointments             In 3 weeks Jerrol Banana., MD [facility], [place]   In 2 months [place], [place], MD [place]             RABEprazole (ACIPHEX) 20 MG tablet 180 tablet 0    Sig: TAKE 1 TABLET TWICE A DAY     Gastroenterology: Proton Pump Inhibitors Passed - 12/02/2020 12:59 AM      Passed - Valid encounter within last 12 months    Recent Outpatient Visits           11 months ago Annual physical exam    [facility] Jer[facility]   1 year ago DVT of deep femoral vein, left [facility])   [facility] Jerrol Banana., MD   1 year ago Annual physical exam   [facility]   3 years ago Annual physical exam   [facility] MD   3 years ago Adult hypothyroidism   [facility] Jerrol Banana., MD       Future Appointments             In 3 weeks Jerrol Banana., MD [facility], [place]   In 2 months [place], [place], [place]               Requested Prescriptions  Pending Prescriptions Disp Refills   topiramate (TOPAMAX) 50 MG tablet [Pharmacy Med Name: TOPIRAMATE TABS 50MG ] 90 tablet 3    Sig: TAKE 1 TABLET DAILY     Not Delegated - Neurology: Anticonvulsants - topiramate & zonisamide Failed - 12/02/2020 12:59 AM      Failed - This refill cannot be delegated      Passed - Cr in normal range and within 360 days    Creatinine, Ser  Date Value Ref Range Status  03/06/2020 0.98 0.57 - 1.00 mg/dL Final          Passed - CO2 in normal range and within 360 days    CO2  Date Value Ref Range Status  03/06/2020 21 20 - 29 mmol/L Final          Passed - Valid encounter within last 12 months    Recent Outpatient Visits           11 months ago Annual physical exam   [facility] Jer[facility]   1 year ago DVT  of deep femoral vein, left [facility])   [facility]   1 year ago Annual physical exam   [facility] Jer[facility] Ban[facility]   3 years ago Annual physical exam   [facility] Jer[facility] Ban[facility] MD   3 years ago Adult hypothyroidism   [facility] Jerrol Banana., MD       Future Appointments             In 3 weeks Jerrol Banana., MD [facility], [place]    In 2 months [place], [place], MD [place]            Signed Prescriptions Disp Refills   DULoxetine (CYMBALTA) 60 MG capsule 30 capsule 0    Sig: TAKE 1 CAPSULE DAILY     Psychiatry: Antidepressants - SNRI Failed - 12/02/2020 12:59 AM      Failed - Valid encounter within last 6 months    Recent Outpatient Visits           11 months ago Annual physical exam   [facility]   1 year ago DVT of deep femoral vein, left [facility])   [facility] Jerrol Ban[facility]., MD   1 year ago Annual physical exam   [facility] Jer[facility] Ban[facility]., MD   3 years ago Annual physical exam   [facility] Jerrol Banana., MD   3 years ago Adult hypothyroidism   [facility] Jerrol Banana., MD       Future Appointments             In 3 weeks Jerrol Banana., MD [facility], [place]   In 2 months [place], [place], MD [place] - Completed PHQ-2 or PHQ-9 in the last 360 days      Passed - Last BP in normal range    BP Readings from Last 1 Encounters:  05/31/20 782/42           folic acid (FOLVITE) 1 MG tablet 90 tablet 2    Sig: TAKE 1 TABLET DAILY     Endocrinology:  Vitamins Passed - 12/02/2020 12:59 AM      Passed - Valid encounter within last 12 months    Recent Outpatient Visits           11 months ago Annual physical exam   [facility]   1 year ago DVT of deep femoral vein, left [facility])   [facility] Jerrol Banana., MD   1 year ago Annual physical exam   [facility] Jerrol Banana., MD   3 years ago Annual physical exam   [facility] Jerrol Ban[facility]., MD   3 years ago Adult hypothyroidism   [facility] Jerrol Banana., MD       Future Appointments             In 3 weeks  Jerrol Banana., MD [facility], [place]   In 2 months [place], [place], MD [place]             RABEprazole (ACIPHEX) 20 MG tablet 180 tablet 0    Sig: TAKE 1 TABLET TWICE A  DAY     Gastroenterology: Proton Pump Inhibitors Passed - 12/02/2020 12:59 AM      Passed - Valid encounter within last 12 months    Recent Outpatient Visits           11 months ago Annual physical exam   [facility] Jer[facility] Ban[facility]., MD   1 year ago DVT of deep femoral vein, left [facility])   [facility] MD   1 year ago Annual physical exam   [facility] Jerrol Banana., MD   3 years ago Annual physical exam   [facility] Jerrol Banana., MD   3 years ago Adult hypothyroidism   [facility] Jerrol Banana., MD       Future Appointments             In 3 weeks Jerrol Banana., MD [facility], [place]   In 2 months [facility], [place], MD [place]

## 2020-12-02 NOTE — Telephone Encounter (Signed)
Patient has appointment 12/26/20 Duloxetine- due RF- 30 day supply- per protocol Folic acid- change in pharmacy- remainder of Rx sent to mail order Rabeprazole- due can RF x1 Requested Prescriptions  Pending Prescriptions Disp Refills  . DULoxetine (CYMBALTA) 60 MG capsule [Pharmacy Med Name: DULOXETINE HCL DR CAPS 60MG ] 30 capsule 0    Sig: TAKE 1 CAPSULE DAILY     Psychiatry: Antidepressants - SNRI Failed - 12/02/2020 12:59 AM      Failed - Valid encounter within last 6 months    Recent Outpatient Visits          11 months ago Annual physical exam   [facility] Jer[facility] MD   1 year ago DVT of deep femoral vein, left [facility])   [facility] Jerrol Banana., MD   1 year ago Annual physical exam   [facility] Jerrol Banana., MD   3 years ago Annual physical exam   [facility] Jer[facility] Ban[facility]., MD   3 years ago Adult hypothyroidism   [facility] Jerrol Banana., MD      Future Appointments            In 3 weeks Jerrol Banana., MD [facility], [place]   In 2 months [place], [place], MD [place] - Completed PHQ-2 or PHQ-9 in the last 360 days      Passed - Last BP in normal range    BP Readings from Last 1 Encounters:  05/31/20 124/78         . topiramate (TOPAMAX) 50 MG tablet [Pharmacy Med Name: TOPIRAMATE TABS 50MG ] 90 tablet 3    Sig: TAKE 1 TABLET DAILY     Not Delegated - Neurology: Anticonvulsants - topiramate & zonisamide Failed - 12/02/2020 12:59 AM      Failed - This refill cannot be delegated      Passed - Cr in normal range and within 360 days    Creatinine, Ser  Date Value Ref Range Status  03/06/2020 0.98 0.57 - 1.00 mg/dL Final         Passed - CO2 in normal range and within 360 days    CO2  Date Value Ref Range Status  03/06/2020 21 20 - 29 mmol/L Final         Passed - Valid encounter  within last 12 months    Recent Outpatient Visits          11 months ago Annual physical exam   [facility] Jerrol Banana., MD   1 year ago DVT of deep femoral vein, left [facility])   [facility] Jer[facility] MD   1 year ago Annual physical exam   [facility] Jerrol Banana., MD   3 years ago Annual physical exam   [facility] Jerrol Banana., MD   3 years ago Adult hypothyroidism   [facility] Jerrol Banana., MD      Future Appointments            In 3 weeks Jerrol Banana., MD [facility], [place]   In 2 months [facility], [place], MD [place]           . folic acid (FOLVITE) 1 MG tablet [Pharmacy Med Name: FOLIC ACID TABS 1MG ] 90  tablet 2    Sig: TAKE 1 TABLET DAILY     Endocrinology:  Vitamins Passed - 12/02/2020 12:59 AM      Passed - Valid encounter within last 12 months    Recent Outpatient Visits          11 months ago Annual physical exam   [facility]   1 year ago DVT of deep femoral vein, left [facility])   [facility] Jerrol Banana., MD   1 year ago Annual physical exam   [facility] Jerrol Banana., MD   3 years ago Annual physical exam   [facility] Jerrol Banana., MD   3 years ago Adult hypothyroidism   [facility] Jerrol Banana., MD      Future Appointments            In 3 weeks [facility]rol Banana., MD [facility], [place]   In 2 months [facility], [place], MD [place]           . RABEprazole (ACIPHEX) 20 MG tablet [Pharmacy Med Name: RABEPRAZOLE SODIUM DR TABS 20MG ] 180 tablet 0    Sig: TAKE 1 TABLET TWICE A DAY     Gastroenterology: Proton Pump Inhibitors Passed - 12/02/2020 12:59 AM      Passed - Valid encounter within last 12 months    Recent Outpatient Visits           11 months ago Annual physical exam   [facility] Jer[facility]., MD   1 year ago DVT of deep femoral vein, left [facility])   [facility] Jer[facility] Ban[facility]., MD   1 year ago Annual physical exam   [facility] Jer[facility]., [facility]   3 years ago Annual physical exam   [facility] Jerrol Banana., MD   3 years ago Adult hypothyroidism   [facility] Jer[facility]      Future Appointments            In 3 weeks Jerrol Banana., MD [facility], [place]   In 2 months [facility], [place], MD [place]

## 2020-12-04 ENCOUNTER — Ambulatory Visit: Payer: Self-pay | Admitting: *Deleted

## 2020-12-04 NOTE — Telephone Encounter (Signed)
Summary: TDAP   Patient inquiring if she is due for her TDAP . Patient daughter is having a baby in November. In order for the patient to be in the delivery room patient has to have a current  TDAP, please advise       Pt reports her daughter is to deliver in 2 weeks. States they were told they need a Tdap 14 weeks prior to delivery. Noted last Tdap 05/05/06.  Pt states she had a Td in 2019 and wants to be sure it was just Td and not Tdap.States she thought it was Tdap. Did note Td only but assured pt would route to practice for review to be certain. Pt is scheduled elsewhere to receive Tdap Saturday.  Please advise: [phone]      Reason for Disposition  [1] Caller requesting NON-URGENT health information AND [2] PCP's office is the best resource  Answer Assessment - Initial Assessment Questions 1. REASON FOR CALL or QUESTION: "What is your reason for calling today?" or "How can I best help you?" or "What question do you have that I can help answer?"     Tdap  Protocols used: Information Only Call - No Triage-A-AH

## 2020-12-05 NOTE — Telephone Encounter (Signed)
Patient was advised she does need the TDAP vaccine.

## 2020-12-09 ENCOUNTER — Other Ambulatory Visit: Payer: Self-pay

## 2020-12-09 DIAGNOSIS — L409 Psoriasis, unspecified: Secondary | ICD-10-CM

## 2020-12-09 MED ORDER — OTEZLA 30 MG PO TABS: 30.0000 mg | ORAL_TABLET | Freq: Two times a day (BID) | ORAL | 2 refills | Status: DC

## 2020-12-13 ENCOUNTER — Telehealth: Payer: Self-pay

## 2020-12-13 NOTE — Telephone Encounter (Signed)
Noted  

## 2020-12-13 NOTE — Telephone Encounter (Signed)
Copied from [place] [id number]. Topic: General - Other >> Dec 13, 2020  1:12 PM Leward Quan A wrote: Reason for CRM: Patient  called in to say that on 12/09/20 they received the TDAP vaccine at the [facility] pharmacy

## 2020-12-26 ENCOUNTER — Other Ambulatory Visit: Payer: Self-pay

## 2020-12-26 ENCOUNTER — Ambulatory Visit (INDEPENDENT_AMBULATORY_CARE_PROVIDER_SITE_OTHER): Payer: BC Managed Care – PPO | Admitting: Family Medicine

## 2020-12-26 VITALS — BP 102/69 | HR 63 | Temp 98.3°F | Wt 174.0 lb

## 2020-12-26 DIAGNOSIS — E042 Nontoxic multinodular goiter: Secondary | ICD-10-CM

## 2020-12-26 DIAGNOSIS — I82402 Acute embolism and thrombosis of unspecified deep veins of left lower extremity: Secondary | ICD-10-CM

## 2020-12-26 DIAGNOSIS — I059 Rheumatic mitral valve disease, unspecified: Secondary | ICD-10-CM | POA: Diagnosis not present

## 2020-12-26 DIAGNOSIS — Z0000 Encounter for general adult medical examination without abnormal findings: Secondary | ICD-10-CM

## 2020-12-26 DIAGNOSIS — E785 Hyperlipidemia, unspecified: Secondary | ICD-10-CM

## 2020-12-26 DIAGNOSIS — Z8669 Personal history of other diseases of the nervous system and sense organs: Secondary | ICD-10-CM

## 2020-12-26 DIAGNOSIS — F411 Generalized anxiety disorder: Secondary | ICD-10-CM

## 2020-12-26 NOTE — Progress Notes (Signed)
Complete physical exam   Patient: Crystal Russo   DOB: 13-Mar-1960   60 y.o. Female  MRN: [id number] Visit Date: 12/26/2020  Today's healthcare provider: [name], MD   No chief complaint on file.  Subjective    Crystal Russo is a 60 y.o. female who presents today for a complete physical exam.  She reports consuming a general diet. Home exercise routine includes walking 15 hrs per week. She generally feels well. She reports sleeping well. She does not have additional problems to discuss today.  She is married mother of 3, all of her children are now married.  She is getting ready to have her first grandchild this week. HPI  Her left leg is back to normal size and she has no pain after large DVT last year Patient did have a severe gastritis 5 weeks ago which is now completely resolved. Past Medical History:  Diagnosis Date   Cancer ([place])    skin cancer   Chronic kidney disease    improved post discontinuing NSAIDS   Hx of dysplastic nevus 10/13/2012   L proximal ant thigh, mild atypia   Hx of dysplastic nevus 10/13/2012   L dorsum foot, mild atypia   Hx of dysplastic nevus 10/22/2014   R ant lat ankle, mild atypia   Hypothyroidism    IBS (irritable bowel syndrome)    Mitral valve prolapse    Past Surgical History:  Procedure Laterality Date   ABDOMINAL HYSTERECTOMY     APPENDECTOMY     BREAST SURGERY     left nipple biopsy   MANDIBLE SURGERY     PERIPHERAL VASCULAR THROMBECTOMY Left 11/02/2019   Procedure: PERIPHERAL VASCULAR THROMBECTOMY / THROMBOLYSIS;  Surgeon: Algernon Huxley, MD;  Location: [facility];  Service: Cardiovascular;  Laterality: Left;   SHOULDER SURGERY     TUBAL LIGATION     UPPER GI ENDOSCOPY  10/26/07   reflux esophagitis and a single gastric polyp   Social History   Socioeconomic History   Marital status: Married    Spouse name: Not on file   Number of children: Not on file   Years of education: Not on file   Highest  education level: Not on file  Occupational History   Not on file  Tobacco Use   Smoking status: Never   Smokeless tobacco: Never  Vaping Use   Vaping Use: Never used  Substance and Sexual Activity   Alcohol use: Yes    Comment: once a month   Drug use: No   Sexual activity: Yes    Birth control/protection: None  Other Topics Concern   Not on file  Social History Narrative   Not on file   Social Determinants of Health   Financial Resource Strain: Not on file  Food Insecurity: Not on file  Transportation Needs: Not on file  Physical Activity: Not on file  Stress: Not on file  Social Connections: Not on file  Intimate Partner Violence: Not on file   Family Status  Relation Name Status   Mother  Alive   Father  Alive   Sister  Alive   Brother  Alive   Brother  Deceased   Brother  Alive   Daughter  Alive   Son  Alive   Daughter  Alive   Mat Aunt  (Not Specified)   MGM  (Not Specified)   MGF  (Not Specified)   PGF  (Not Specified)   Family  History  Problem Relation Age of Onset   Hypertension Mother    Heart disease Mother    Arthritis Mother    Berenice Primas' disease Mother    Thyroid cancer Mother    Fibroids Mother    Diverticulosis Mother    Basal cell carcinoma Mother    Ulcers Father    Hyperthyroidism Father    Arthritis Father    Congestive Heart Failure Father    Diverticulosis Father    Lung cancer Father    Fibroids Sister    Hypertension Sister    Fibromyalgia Sister    Heart disease Sister    Lupus Sister    Hypertension Brother    Hyperlipidemia Brother    Skin cancer Brother    Congenital heart disease Brother    Stroke Brother    Hypertension Brother    Breast cancer Maternal Aunt    Stroke Maternal Grandmother    Stroke Maternal Grandfather    Liver cancer Paternal Grandfather    Allergies  Allergen Reactions   Nsaids    Other     Patient Care Team: Jerrol Banana., MD as PCP - General (Family Medicine)    Medications: Outpatient Medications Prior to Visit  Medication Sig   apixaban (ELIQUIS) 5 MG TABS tablet Take 1 tablet (5 mg total) by mouth 2 (two) times daily.   Apremilast (OTEZLA) 30 MG TABS Take 1 tablet (30 mg total) by mouth 2 (two) times daily.   betamethasone dipropionate (DIPROLENE) 0.05 % ointment Apply topically 2 (two) times daily.    Calcipotriene 0.005 % FOAM Apply topically. Apply as needed on weekly on Saturday   cetirizine (ZYRTEC) 10 MG tablet Take 10 mg by mouth daily.   Cholecalciferol (VITAMIN D) 2000 units CAPS Take by mouth.   clidinium-chlordiazePOXIDE (LIBRAX) 5-2.5 MG capsule TAKE 1 CAPSULE TWICE A DAY AS NEEDED   DULoxetine (CYMBALTA) 60 MG capsule TAKE 1 CAPSULE DAILY   estradiol (VIVELLE-DOT) 0.1 MG/24HR patch Place 1 patch onto the skin 2 (two) times a week.    fluticasone (FLONASE) 50 MCG/ACT nasal spray USE 2 SPRAYS IN THE NOSE DAILY AS DIRECTED (Patient taking differently: as needed.)   folic acid (FOLVITE) 1 MG tablet TAKE 1 TABLET DAILY   levothyroxine (SYNTHROID) 150 MCG tablet TAKE 1 TABLET DAILY BEFORE BREAKFAST   RABEprazole (ACIPHEX) 20 MG tablet TAKE 1 TABLET TWICE A DAY   topiramate (TOPAMAX) 50 MG tablet TAKE 1 TABLET DAILY   [DISCONTINUED] lidocaine (LIDODERM) 5 % Place 1 patch onto the skin every 12 (twelve) hours. Remove & Discard patch within 12 hours or as directed by MD   [DISCONTINUED] orphenadrine (NORFLEX) 100 MG tablet Take 1 tablet (100 mg total) by mouth 2 (two) times daily.   No facility-administered medications prior to visit.    Review of Systems  Constitutional: Negative.   HENT:  Positive for sore throat.   Eyes: Negative.   Respiratory: Negative.    Cardiovascular: Negative.   Gastrointestinal: Negative.   Endocrine: Negative.   Genitourinary: Negative.   Musculoskeletal: Negative.   Skin: Negative.   Allergic/Immunologic: Negative.   Neurological: Negative.   Hematological: Negative.   Psychiatric/Behavioral:  Negative.        Objective    BP 102/69 (BP Location: Right Arm, Patient Position: Sitting, Cuff Size: Normal)   Pulse 63   Temp 98.3 F (36.8 C) (Oral)   Wt 174 lb (78.9 kg)   SpO2 99%   BMI 26.46 kg/m  BP Readings  from Last 3 Encounters:  12/26/20 102/69  05/31/20 124/78  12/27/19 135/68   Wt Readings from Last 3 Encounters:  12/26/20 174 lb (78.9 kg)  05/31/20 173 lb (78.5 kg)  12/27/19 173 lb (78.5 kg)      Physical Exam Constitutional:      Appearance: Normal appearance. She is normal weight.  HENT:     Head: Normocephalic and atraumatic.     Right Ear: Tympanic membrane, ear canal and external ear normal.     Left Ear: Tympanic membrane, ear canal and external ear normal.     Nose: Nose normal.     Mouth/Throat:     Mouth: Mucous membranes are moist.     Pharynx: Oropharynx is clear.  Eyes:     Extraocular Movements: Extraocular movements intact.     Conjunctiva/sclera: Conjunctivae normal.     Pupils: Pupils are equal, round, and reactive to light.  Cardiovascular:     Rate and Rhythm: Normal rate and regular rhythm.     Pulses: Normal pulses.     Heart sounds: Normal heart sounds.  Pulmonary:     Effort: Pulmonary effort is normal.     Breath sounds: Normal breath sounds.  Abdominal:     General: Abdomen is flat. Bowel sounds are normal.     Palpations: Abdomen is soft.  Musculoskeletal:     Cervical back: Normal range of motion and neck supple.  Skin:    General: Skin is warm and dry.  Neurological:     General: No focal deficit present.     Mental Status: She is alert and oriented to person, place, and time.  Psychiatric:        Mood and Affect: Mood normal.        Behavior: Behavior normal.        Thought Content: Thought content normal.        Judgment: Judgment normal.      Last depression screening scores PHQ 2/9 Scores 12/26/2020 12/26/2019 12/05/2018  PHQ - 2 Score 0 0 0  PHQ- 9 Score 0 0 -   Last fall risk screening Fall Risk   12/26/2020  Falls in the past year? 1  Number falls in past yr: 0  Injury with Fall? 0   Last Audit-C alcohol use screening Alcohol Use Disorder Test (AUDIT) 12/26/2020  1. How often do you have a drink containing alcohol? 0  2. How many drinks containing alcohol do you have on a typical day when you are drinking? 0  3. How often do you have six or more drinks on one occasion? 0  AUDIT-C Score 0  Alcohol Brief Interventions/Follow-up -   A score of 3 or more in women, and 4 or more in men indicates increased risk for alcohol abuse, EXCEPT if all of the points are from question 1   Results for orders placed or performed in visit on 12/26/20  HM COLONOSCOPY  Result Value Ref Range   HM Colonoscopy See Report (in chart) See Report (in chart), Patient Reported    Assessment & Plan    Routine Health Maintenance and Physical Exam  Exercise Activities and Dietary recommendations  Goals   None     Immunization History  Administered Date(s) Administered   Influenza, Seasonal, Injecte, Preservative Fre 11/26/2016   Influenza,inj,Quad PF,6+ Mos 12/06/2017, 12/05/2018, 12/26/2019   Td 05/14/2017   Tdap 05/05/2006, 12/09/2020   Zoster Recombinat (Shingrix) 12/26/2019    Health Maintenance  Topic Date Due  Pneumococcal Vaccine 7-46 Years old (1 - PCV) Never done   HIV Screening  Never done   MAMMOGRAM  12/08/2017   PAP SMEAR-Modifier  12/09/2018   Zoster Vaccines- Shingrix (2 of 2) 02/20/2020   INFLUENZA VACCINE  09/23/2020   COLONOSCOPY (Pts 45-18yrs Insurance coverage will need to be confirmed)  01/05/2028   TETANUS/TDAP  12/10/2030   Hepatitis C Screening  Completed   HPV VACCINES  Aged Out    Discussed health benefits of physical activity, and encouraged her to engage in regular exercise appropriate for her age and condition.  1. Annual physical exam Up-to-date on mammogram colonoscopy.  Tdap updated. - CBC with Differential/Platelet - Comprehensive metabolic panel -  Lipid Panel With LDL/HDL Ratio  2. Leg DVT (deep venous thromboembolism), acute, left (HCC) On Eliquis and followed by vascular  3. Mitral valve disorder   4. Multinodular goiter   5. Hyperlipidemia, unspecified hyperlipidemia type   6. History of migraine headaches   7. Anxiety, generalized    No follow-ups on file.     I, [name], MD, have reviewed all documentation for this visit. The documentation on 01/03/21 for the exam, diagnosis, procedures, and orders are all accurate and complete.    Kasiya Burck Cranford Mon, MD  [facility] [phone] (phone) [phone] (fax)  [place]

## 2020-12-27 ENCOUNTER — Other Ambulatory Visit: Payer: Self-pay | Admitting: Family Medicine

## 2020-12-27 DIAGNOSIS — F320 Major depressive disorder, single episode, mild: Secondary | ICD-10-CM

## 2020-12-27 LAB — COMPREHENSIVE METABOLIC PANEL
ALT: 13 IU/L (ref 0–32)
AST: 14 IU/L (ref 0–40)
Albumin/Globulin Ratio: 1.7 (ref 1.2–2.2)
Albumin: 4.4 g/dL (ref 3.8–4.9)
Alkaline Phosphatase: 106 IU/L (ref 44–121)
BUN/Creatinine Ratio: 14 (ref 9–23)
BUN: 14 mg/dL (ref 6–24)
Bilirubin Total: <0.2 mg/dL (ref 0.0–1.2)
CO2: 22 mmol/L (ref 20–29)
Calcium: 10 mg/dL (ref 8.7–10.2)
Chloride: 105 mmol/L (ref 96–106)
Creatinine, Ser: 1.03 mg/dL — ABNORMAL HIGH (ref 0.57–1.00)
Globulin, Total: 2.6 g/dL (ref 1.5–4.5)
Glucose: 94 mg/dL (ref 70–99)
Potassium: 4.3 mmol/L (ref 3.5–5.2)
Sodium: 140 mmol/L (ref 134–144)
Total Protein: 7 g/dL (ref 6.0–8.5)
eGFR: 63 mL/min/{1.73_m2} (ref >59)

## 2020-12-27 LAB — CBC WITH DIFFERENTIAL/PLATELET
Basophils Absolute: 0.1 10*3/uL (ref 0.0–0.2)
Basos: 1 %
EOS (ABSOLUTE): 0.2 10*3/uL (ref 0.0–0.4)
Eos: 3 %
Hematocrit: 36 % (ref 34.0–46.6)
Hemoglobin: 12.2 g/dL (ref 11.1–15.9)
Immature Grans (Abs): 0 10*3/uL (ref 0.0–0.1)
Immature Granulocytes: 0 %
Lymphocytes Absolute: 2.1 10*3/uL (ref 0.7–3.1)
Lymphs: 37 %
MCH: 28.6 pg (ref 26.6–33.0)
MCHC: 33.9 g/dL (ref 31.5–35.7)
MCV: 85 fL (ref 79–97)
Monocytes Absolute: 0.6 10*3/uL (ref 0.1–0.9)
Monocytes: 10 %
Neutrophils Absolute: 2.9 10*3/uL (ref 1.4–7.0)
Neutrophils: 49 %
Platelets: 406 10*3/uL (ref 150–450)
RBC: 4.26 x10E6/uL (ref 3.77–5.28)
RDW: 13.9 % (ref 11.7–15.4)
WBC: 5.9 10*3/uL (ref 3.4–10.8)

## 2020-12-27 LAB — LIPID PANEL WITH LDL/HDL RATIO
Cholesterol, Total: 231 mg/dL — ABNORMAL HIGH (ref 100–199)
HDL: 71 mg/dL (ref >39)
LDL Chol Calc (NIH): 147 mg/dL — ABNORMAL HIGH (ref 0–99)
LDL/HDL Ratio: 2.1 ratio (ref 0.0–3.2)
Triglycerides: 73 mg/dL (ref 0–149)
VLDL Cholesterol Cal: 13 mg/dL (ref 5–40)

## 2020-12-27 NOTE — Telephone Encounter (Signed)
Requested Prescriptions  Pending Prescriptions Disp Refills  . DULoxetine (CYMBALTA) 60 MG capsule [Pharmacy Med Name: DULOXETINE HCL DR CAPS 60MG ] 90 capsule 3    Sig: TAKE 1 CAPSULE DAILY     Psychiatry: Antidepressants - SNRI Passed - 12/27/2020  9:34 AM      Passed - Completed PHQ-2 or PHQ-9 in the last 360 days      Passed - Last BP in normal range    BP Readings from Last 1 Encounters:  12/26/20 102/69         Passed - Valid encounter within last 6 months    Recent Outpatient Visits          Yesterday Annual physical exam   [facility] Jerrol Banana., MD   1 year ago Annual physical exam   [facility] Jerrol Banana., MD   1 year ago DVT of deep femoral vein, left [facility])   [facility] Jerrol Banana., MD   2 years ago Annual physical exam   [facility] Jerrol Banana., MD   3 years ago Annual physical exam   [facility] Jerrol Banana., MD      Future Appointments            In 1 month [facility] [place], MD [place]   In 1 year Jerrol Banana., MD [facility] [place]

## 2021-02-20 ENCOUNTER — Encounter: Payer: Self-pay | Admitting: Dermatology

## 2021-02-20 ENCOUNTER — Telehealth: Payer: Self-pay | Admitting: Dermatology

## 2021-02-20 ENCOUNTER — Ambulatory Visit: Payer: BC Managed Care – PPO | Admitting: Dermatology

## 2021-02-20 ENCOUNTER — Other Ambulatory Visit: Payer: Self-pay

## 2021-02-20 DIAGNOSIS — L821 Other seborrheic keratosis: Secondary | ICD-10-CM

## 2021-02-20 DIAGNOSIS — L409 Psoriasis, unspecified: Secondary | ICD-10-CM | POA: Diagnosis not present

## 2021-02-20 DIAGNOSIS — Z86018 Personal history of other benign neoplasm: Secondary | ICD-10-CM

## 2021-02-20 DIAGNOSIS — L720 Epidermal cyst: Secondary | ICD-10-CM

## 2021-02-20 DIAGNOSIS — Z79899 Other long term (current) drug therapy: Secondary | ICD-10-CM

## 2021-02-20 DIAGNOSIS — L814 Other melanin hyperpigmentation: Secondary | ICD-10-CM

## 2021-02-20 DIAGNOSIS — D229 Melanocytic nevi, unspecified: Secondary | ICD-10-CM

## 2021-02-20 DIAGNOSIS — D1800 Hemangioma unspecified site: Secondary | ICD-10-CM

## 2021-02-20 DIAGNOSIS — Z1283 Encounter for screening for malignant neoplasm of skin: Secondary | ICD-10-CM | POA: Diagnosis not present

## 2021-02-20 DIAGNOSIS — L578 Other skin changes due to chronic exposure to nonionizing radiation: Secondary | ICD-10-CM

## 2021-02-20 DIAGNOSIS — Z808 Family history of malignant neoplasm of other organs or systems: Secondary | ICD-10-CM | POA: Diagnosis not present

## 2021-02-20 MED ORDER — OTEZLA 30 MG PO TABS: 30.0000 mg | ORAL_TABLET | Freq: Two times a day (BID) | ORAL | 11 refills | Status: DC

## 2021-02-20 NOTE — Progress Notes (Signed)
Follow-Up Visit   Subjective  Crystal Russo is a 60 y.o. female who presents for the following: Annual Exam (Hx dysplastic nevi - fhx MM in mother x 3. Patient has noticed lesions on her back and L ear that she is concerned about and would like checked today. ).  The following portions of the chart were reviewed this encounter and updated as appropriate:   Tobacco   Allergies   Meds   Problems   Med Hx   Surg Hx   Fam Hx       Review of Systems:  No other skin or systemic complaints except as noted in HPI or Assessment and Plan.  Objective  Well appearing patient in no apparent distress; mood and affect are within normal limits.  A full examination was performed including scalp, head, eyes, ears, nose, lips, neck, chest, axillae, abdomen, back, buttocks, bilateral upper extremities, bilateral lower extremities, hands, feet, fingers, toes, fingernails, and toenails. All findings within normal limits unless otherwise noted below.  L foot Clear.  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 Currently well-controlled on [place]. Previously recalcitrant to many other therapies.  Psoriasis is a chronic non-curable, but treatable genetic/hereditary disease that may have other systemic features affecting other organ systems such as joints (Psoriatic Arthritis). It is associated with an increased risk of inflammatory bowel disease, heart disease, non-alcoholic fatty liver disease, and depression.    Continue Otezla 30mg  po BID. Side effects of Otezla (apremilast) include diarrhea, nausea, headache, upper respiratory infection, depression, and weight decrease (5-10%). It should only be taken by pregnant women after a discussion regarding risks and benefits with their doctor. Goal is control of skin condition, not cure.  The use of [place] Nail requires long term medication management, including periodic office  visits.  Consider decreasing Otezla 30mg  to QD and see if still controlled on that dose.   OK to continue Aquaphor ointment to aa's.   Discussed Vtama and Zoryve cream if unable to receive patient assistance for [facility] or if she is interested in trying to come off of oral treatment. Samples given of each.   OK to continue Sorilux foam QD PRN flares and Betamethasone ointment QD PRN flares.    Related Medications Apremilast (OTEZLA) 30 MG TABS Take 1 tablet (30 mg total) by mouth 2 (two) times daily.  Family history of malignant melanoma of skin Mother  Mother has had three melanomas in the past (2 on back, 1 on arm) as well as other types of skin cancers. She has had a niece with MM and her brother has had one frequently has skin cancers (possible melanoma) and irregular lesions treated. Discussed genetic counseling to consider genetic testing.   Milium Left Ear  Benign-appearing.  Observation.  Call clinic for new or changing lesions.     Lentigines - Scattered tan macules - Due to sun exposure - Benign-appearing, observe - Recommend daily broad spectrum sunscreen SPF 30+ to sun-exposed areas, reapply every 2 hours as needed. - Call for any changes  Seborrheic Keratoses - Stuck-on, waxy, tan-brown papules and/or plaques  - Benign-appearing - Discussed benign etiology and prognosis. - Observe - Call for any changes  Melanocytic Nevi - Tan-brown and/or pink-flesh-colored symmetric macules and papules - Benign appearing on exam today - Observation - Call clinic for new or changing moles - Recommend daily use of broad spectrum spf 30+ sunscreen to sun-exposed  areas.   Hemangiomas - Red papules - Discussed benign nature - Observe - Call for any changes  Actinic Damage - Chronic condition, secondary to cumulative UV/sun exposure - diffuse scaly erythematous macules with underlying dyspigmentation - Recommend daily broad spectrum sunscreen SPF 30+ to sun-exposed  areas, reapply every 2 hours as needed.  - Staying in the shade or wearing long sleeves, sun glasses (UVA+UVB protection) and wide brim hats (4-inch brim around the entire circumference of the hat) are also recommended for sun protection.  - Call for new or changing lesions.  History of Dysplastic Nevi - No evidence of recurrence today - Recommend regular full body skin exams - Recommend daily broad spectrum sunscreen SPF 30+ to sun-exposed areas, reapply every 2 hours as needed.  - Call if any new or changing lesions are noted between office visits  Skin cancer screening performed today.  Return in about 1 year (around 02/20/2022) for TBSE and psoriasis follow up .  Luther Redo, CMA, am acting as scribe for Forest Gleason, MD .   Documentation: I have reviewed the above documentation for accuracy and completeness, and I agree with the above.  Forest Gleason, MD

## 2021-02-20 NOTE — Telephone Encounter (Signed)
Can you please advise patient that I do recommend she see genetic counseling to consider genetic testing for risk of melanoma? If she is in agreement, we will send referral to [facility] genetic counseling. Thank you!

## 2021-02-20 NOTE — Patient Instructions (Addendum)
Recommend taking Heliocare sun protection supplement daily in sunny weather for additional sun protection. For maximum protection on the sunniest days, you can take up to 2 capsules of regular Heliocare OR take 1 capsule of Heliocare Ultra. For prolonged exposure (such as a full day in the sun), you can repeat your dose of the supplement 4 hours after your first dose. Heliocare can be purchased at [facility] or at [email or web address].    Niacinamide or Nicotinamide 500mg  is a B3 vitamin that can be taken twice per day to lower risk of non-melanoma skin cancer by approximately 25%. This is usually available at [employer].   If You Need Anything After Your Visit  If you have any questions or concerns for your doctor, please call our main line at [phone] and press option 4 to reach your doctor's medical assistant. If no one answers, please leave a voicemail as directed and we will return your call as soon as possible. Messages left after 4 pm will be answered the following business day.   You may also send [place] a message via [place]. We typically respond to MyChart messages within 1-2 business days.  For prescription refills, please ask your pharmacy to contact our office. Our fax number is [phone].  If you have an urgent issue when the clinic is closed that cannot wait until the next business day, you can page your doctor at the number below.    Please note that while we do our best to be available for urgent issues outside of office hours, we are not available 24/7.   If you have an urgent issue and are unable to reach [place], you may choose to seek medical care at your doctor's office, retail clinic, urgent care center, or emergency room.  If you have a medical emergency, please immediately call 911 or go to the emergency department.  Pager Numbers  - Dr. Nehemiah Massed: [phone]  - Dr. Laurence Ferrari: [phone]  - Dr. Nicole Kindred: [phone]  In the event of inclement weather,  please call our main line at [phone] for an update on the status of any delays or closures.  Dermatology Medication Tips: Please keep the boxes that topical medications come in in order to help keep track of the instructions about where and how to use these. Pharmacies typically print the medication instructions only on the boxes and not directly on the medication tubes.   If your medication is too expensive, please contact our office at [phone] option 4 or send [place] a message through [place].   We are unable to tell what your co-pay for medications will be in advance as this is different depending on your insurance coverage. However, we may be able to find a substitute medication at lower cost or fill out paperwork to get insurance to cover a needed medication.   If a prior authorization is required to get your medication covered by your insurance company, please allow [place] 1-2 business days to complete this process.  Drug prices often vary depending on where the prescription is filled and some pharmacies may offer cheaper prices.  The website www.goodrx.com contains coupons for medications through different pharmacies. The prices here do not account for what the cost may be with help from insurance (it may be cheaper with your insurance), but the website can give you the price if you did not use any insurance.  - You can print the associated coupon and take it with your prescription to the pharmacy.  - You may  also stop by our office during regular business hours and pick up a GoodRx coupon card.  - If you need your prescription sent electronically to a different pharmacy, notify our office through [facility] or by phone at [phone] option 4.     Si Usted Necesita Algo Despus de Su Visita  Tambin puede enviarnos un mensaje a travs de Pharmacist, community. Por lo general respondemos a los mensajes de MyChart en el transcurso de 1 a 2 das hbiles.  Para renovar recetas, por favor  pida a su farmacia que se ponga en contacto con nuestra oficina. Harland Dingwall de fax es [place] [phone].  Si tiene un asunto urgente cuando la clnica est cerrada y que no puede esperar hasta el siguiente da hbil, puede llamar/localizar a su doctor(a) al nmero que aparece a continuacin.   Por favor, tenga en cuenta que aunque hacemos todo lo posible para estar disponibles para asuntos urgentes fuera del horario de [place], no estamos disponibles las 24 horas del da, los 7 das de la [place].   Si tiene un problema urgente y no puede comunicarse con nosotros, puede optar por buscar atencin mdica  en el consultorio de su doctor(a), en una clnica privada, en un centro de atencin urgente o en una sala de emergencias.  Si tiene Engineering geologist, por favor llame inmediatamente al 911 o vaya a la sala de emergencias.  Nmeros de bper  - Dr. Nehemiah Massed: [phone]  - Dra. Moye: [phone]  - Dra. Nicole Kindred: [phone]  En caso de inclemencias del [place], por favor llame a Johnsie Kindred principal al [phone] para una actualizacin sobre el [place] de cualquier retraso o cierre.  Consejos para la medicacin en dermatologa: Por favor, guarde las cajas en las que vienen los medicamentos de uso tpico para ayudarle a seguir las instrucciones sobre dnde y cmo usarlos. Las farmacias generalmente imprimen las instrucciones del medicamento slo en las cajas y no directamente en los tubos del [place].   Si su medicamento es muy caro, por favor, pngase en contacto con Zigmund Daniel llamando al [phone] y presione la opcin 4 o envenos un mensaje a travs de Pharmacist, community.   No podemos decirle cul ser su copago por los medicamentos por adelantado ya que esto es diferente dependiendo de la cobertura de su seguro. Sin embargo, es posible que podamos encontrar un medicamento sustituto a Electrical engineer un formulario para que el seguro cubra el medicamento que se considera  necesario.   Si se requiere una autorizacin previa para que su compaa de seguros [place] su medicamento, por favor permtanos de 1 a 2 das hbiles para completar este proceso.  Los precios de los medicamentos varan con frecuencia dependiendo del Environmental consultant de dnde se surte la receta y alguna farmacias pueden ofrecer precios ms baratos.  El sitio web www.goodrx.com tiene cupones para medicamentos de Airline pilot. Los precios aqu no tienen en cuenta lo que podra costar con la ayuda del seguro (puede ser ms barato con su seguro), pero el sitio web puede darle el precio si no utiliz Research scientist (physical sciences).  - Puede imprimir el cupn correspondiente y llevarlo con su receta a la farmacia.  - Tambin puede pasar por nuestra oficina durante el horario de atencin regular y Charity fundraiser una tarjeta de cupones de GoodRx.  - Si necesita que su receta se enve electrnicamente a Chiropodist, informe a nuestra oficina a travs de MyChart de [place] o por telfono llamando al [phone] y presione la  opcin 4.

## 2021-02-25 ENCOUNTER — Other Ambulatory Visit: Payer: Self-pay

## 2021-02-25 DIAGNOSIS — Z808 Family history of malignant neoplasm of other organs or systems: Secondary | ICD-10-CM

## 2021-02-26 ENCOUNTER — Telehealth: Payer: Self-pay | Admitting: Genetic Counselor

## 2021-02-26 NOTE — Telephone Encounter (Signed)
Scheduled appt per 1/3 referral. Pt is aware of appt date and time. Pt requested a virtual visit. Scheduled pt for MyChart video visit and gave her instructions for how to access video visit for day of appt.

## 2021-02-28 ENCOUNTER — Other Ambulatory Visit: Payer: Self-pay | Admitting: Family Medicine

## 2021-02-28 DIAGNOSIS — K219 Gastro-esophageal reflux disease without esophagitis: Secondary | ICD-10-CM

## 2021-02-28 NOTE — Telephone Encounter (Signed)
Requested Prescriptions  Pending Prescriptions Disp Refills   RABEprazole (ACIPHEX) 20 MG tablet [Pharmacy Med Name: RABEPRAZOLE SODIUM DR TABS 20MG ] 180 tablet 3    Sig: TAKE 1 TABLET TWICE A DAY     Gastroenterology: Proton Pump Inhibitors Passed - 02/28/2021  1:27 AM      Passed - Valid encounter within last 12 months    Recent Outpatient Visits          2 months ago Annual physical exam   [facility] Jerrol Banana., MD   1 year ago Annual physical exam   [facility] Jerrol Banana., MD   1 year ago DVT of deep femoral vein, left [facility])   [facility] Jerrol Banana., MD   2 years ago Annual physical exam   [facility] Jerrol Banana., MD   3 years ago Annual physical exam   [facility] Jerrol Banana., MD      Future Appointments            In 3 weeks Florene Glen Maebelle Munroe, [facility]   In 10 months Jerrol Banana., MD [facility], [place]

## 2021-03-10 ENCOUNTER — Other Ambulatory Visit: Payer: Self-pay | Admitting: Family Medicine

## 2021-03-10 DIAGNOSIS — K589 Irritable bowel syndrome without diarrhea: Secondary | ICD-10-CM

## 2021-03-21 ENCOUNTER — Telehealth: Payer: Self-pay

## 2021-03-21 NOTE — Telephone Encounter (Signed)
Patient no longer eligible for [facility] and receiving medication through [facility] due to bridge coverage expiring. The patient is eligible for 12 months (from first [place]) or 12 fills, whichever comes first. The patient has had 12 fills. Please advise.

## 2021-03-24 ENCOUNTER — Encounter: Payer: Self-pay | Admitting: Genetic Counselor

## 2021-03-24 NOTE — Telephone Encounter (Signed)
Crystal Russo, Is there a way around this or do we need to change her medication? Thank you!

## 2021-03-25 ENCOUNTER — Other Ambulatory Visit: Payer: Self-pay

## 2021-03-25 DIAGNOSIS — L409 Psoriasis, unspecified: Secondary | ICD-10-CM

## 2021-03-25 NOTE — Telephone Encounter (Signed)
Let's try to run a new PA through her current insurance and see if we can get coverage. I will start the process today.

## 2021-03-25 NOTE — Telephone Encounter (Signed)
Thank you :)

## 2021-03-26 ENCOUNTER — Encounter: Payer: Self-pay | Admitting: Genetic Counselor

## 2021-03-26 ENCOUNTER — Other Ambulatory Visit: Payer: Self-pay | Admitting: Genetic Counselor

## 2021-03-26 ENCOUNTER — Inpatient Hospital Stay: Payer: BC Managed Care – PPO | Attending: Genetic Counselor | Admitting: Genetic Counselor

## 2021-03-26 DIAGNOSIS — Z803 Family history of malignant neoplasm of breast: Secondary | ICD-10-CM | POA: Diagnosis not present

## 2021-03-26 DIAGNOSIS — Z808 Family history of malignant neoplasm of other organs or systems: Secondary | ICD-10-CM

## 2021-03-26 NOTE — Progress Notes (Signed)
REFERRING PROVIDER: Alfonso Patten, [address] [place],  [place] [address]  PRIMARY PROVIDER:  Jerrol Banana., MD  PRIMARY REASON FOR VISIT:  1. Family history of melanoma   2. Family history of breast cancer      HISTORY OF PRESENT ILLNESS:   [name], a 61 y.o. female, was seen for a [place] cancer genetics consultation at the request of Dr. Laurence Ferrari due to a family history of breast cancer and melanoma.  [name] presents to clinic today to discuss the possibility of a hereditary predisposition to cancer, genetic testing, and to further clarify her future cancer risks, as well as potential cancer risks for family members.   [name] is a 61 y.o. female with no personal history of cancer.    CANCER HISTORY:  Oncology History   No history exists.     RISK FACTORS:  Menarche was at age 7.  First live birth at age 80.  Ovaries intact: no.  Hysterectomy: yes.  Menopausal status: postmenopausal.  HRT use:  18  years. Colonoscopy: yes; normal. Mammogram within the last year: yes. Number of breast biopsies: 0. Up to date with pelvic exams: yes. Any excessive radiation exposure in the past: no  Past Medical History:  Diagnosis Date   Cancer ([place])    skin cancer   Chronic kidney disease    improved post discontinuing NSAIDS   Hx of dysplastic nevus 10/13/2012   L proximal ant thigh, mild atypia   Hx of dysplastic nevus 10/13/2012   L dorsum foot, mild atypia   Hx of dysplastic nevus 10/22/2014   R ant lat ankle, mild atypia   Hypothyroidism    IBS (irritable bowel syndrome)    Mitral valve prolapse     Past Surgical History:  Procedure Laterality Date   ABDOMINAL HYSTERECTOMY     APPENDECTOMY     BREAST SURGERY     left nipple biopsy   MANDIBLE SURGERY     PERIPHERAL VASCULAR THROMBECTOMY Left 11/02/2019   Procedure: PERIPHERAL VASCULAR THROMBECTOMY / THROMBOLYSIS;  Surgeon: Algernon Huxley, MD;  Location: [facility];  Service:  Cardiovascular;  Laterality: Left;   SHOULDER SURGERY     TUBAL LIGATION     UPPER GI ENDOSCOPY  10/26/07   reflux esophagitis and a single gastric polyp    Social History   Socioeconomic History   Marital status: Married    Spouse name: Not on file   Number of children: Not on file   Years of education: Not on file   Highest education level: Not on file  Occupational History   Not on file  Tobacco Use   Smoking status: Never   Smokeless tobacco: Never  Vaping Use   Vaping Use: Never used  Substance and Sexual Activity   Alcohol use: Yes    Comment: once a month   Drug use: No   Sexual activity: Yes    Birth control/protection: None  Other Topics Concern   Not on file  Social History Narrative   Not on file   Social Determinants of Health   Financial Resource Strain: Not on file  Food Insecurity: Not on file  Transportation Needs: Not on file  Physical Activity: Not on file  Stress: Not on file  Social Connections: Not on file     FAMILY HISTORY:  We obtained a detailed, 4-generation family history.  Significant diagnoses are listed below: Family History  Problem Relation Age of Onset  Hypertension Mother    Heart disease Mother    Arthritis Mother    Berenice Primas' disease Mother    Thyroid cancer Mother    Fibroids Mother    Diverticulosis Mother    Basal cell carcinoma Mother    Melanoma Mother 4       3 melanomas in her lifetime   Ulcers Father    Hyperthyroidism Father    Arthritis Father    Congestive Heart Failure Father    Diverticulosis Father    Lung cancer Father    Fibroids Sister    Hypertension Sister    Fibromyalgia Sister    Heart disease Sister    Lupus Sister    Melanoma Brother 51   Hypertension Brother    Hyperlipidemia Brother    Skin cancer Brother    Congenital heart disease Brother    Stroke Brother    Basal cell carcinoma Brother    Hypertension Brother    Breast cancer Maternal Aunt 55   Breast cancer Maternal Aunt 59    Cancer Maternal Uncle        unknown dx   Stroke Maternal Grandmother    Stroke Maternal Grandfather    Dementia Paternal Grandmother    Liver cancer Paternal Grandfather    Melanoma Niece 46       brother's daughter     The patient has two daughters and a son who are cancer free.  She has one daughter who has had several BCC removed.  The patient ha three brothers and a sister.  One brother was diagnosed with melanoma at 26 and has had multiple BCC removed.  Another brother had BCC's and his daughter had melanoma at 4.  The patient's parents are both living.  The patient's mother had melanoma at ages 58, 51 and 67.  She also has had BCC's removed on her face and head. The patient's mother has two sisters and four brothers.  Both sisters had breast cancer one in her early 60's and another in her late 75's.  One uncle had an unknown cancer.  The maternal grandparents both died of strokes.  The patient's father was diagnosed with lung cancer at 107.  He has a brother and sister who were cancer free.  The paternal grandmother died with dementia, and his father died of liver cancer.  [name] is unaware of previous family history of genetic testing for hereditary cancer risks. Patient's maternal ancestors are of [place] and [place] descent, and paternal ancestors are of [place] descent. There is no reported Ashkenazi Jewish ancestry. There is no known consanguinity.  GENETIC COUNSELING ASSESSMENT: [name] is a 61 y.o. female with a family history of melanoma and breast cancer which is somewhat suggestive of a hereditary cancer syndrome and predisposition to cancer given the number of melanoma's in the family and the young ages of onset. We, therefore, discussed and recommended the following at today's visit.   DISCUSSION: We discussed that, in general, most cancer is not inherited in families, but instead is sporadic or familial. Sporadic cancers occur by chance and typically happen at older ages  (>50 years) as this type of cancer is caused by genetic changes acquired during an individuals lifetime. Some families have more cancers than would be expected by chance; however, the ages or types of cancer are not consistent with a known genetic mutation or known genetic mutations have been ruled out. This type of familial cancer is thought to be due to a combination of  multiple genetic, environmental, hormonal, and lifestyle factors. While this combination of factors likely increases the risk of cancer, the exact source of this risk is not currently identifiable or testable.  We discussed that 5 - 10% of melanoma is hereditary, with most cases associated with CDKN2A.  There are other genes that can be associated with hereditary melanoma cancer syndromes.  These include BAP1, POT1 and MITF.  We reviewed that hereditary melanoma syndromes can be classified as a melanoma dominant or melanoma subordinate syndrome.  Melanoma dominant syndromes are characterized with melanoma being a primary cancer in the syndrome.  Melanoma subordinate syndromes are characterized by other cancers being the dominant cancer(s) and melanoma sometimes being seen in the syndrome.  We can see breast cancer and melanoma in a few melanoma subordinate conditions.  Therefore, the number of cases of melanoma in the patient's family is more than we would typically expect in those syndromes.   According to societal criteria, 3 melanomas in a family meet medical criteria for genetic testing.  The patient currently has five cases of melanoma.  We discussed that testing is beneficial for several reasons including knowing how to follow individuals and understand if other family members could be at risk for cancer and allow them to undergo genetic testing.   We reviewed the characteristics, features and inheritance patterns of hereditary cancer syndromes. We also discussed genetic testing, including the appropriate family members to test, the  process of testing, insurance coverage and turn-around-time for results. We discussed the implications of a negative, positive, carrier and/or variant of uncertain significant result. We recommended [name] pursue genetic testing for the CancerNext-Expanded+RNAinsight gene panel.   The CancerNext-Expanded gene panel offered by [facility] and includes sequencing and rearrangement analysis for the following 77 genes: AIP, ALK, APC*, ATM*, AXIN2, BAP1, BARD1, BLM, BMPR1A, BRCA1*, BRCA2*, BRIP1*, CDC73, CDH1*, CDK4, CDKN1B, CDKN2A, CHEK2*, CTNNA1, DICER1, FANCC, FH, FLCN, GALNT12, KIF1B, LZTR1, MAX, MEN1, MET, MLH1*, MSH2*, MSH3, MSH6*, MUTYH*, NBN, NF1*, NF2, NTHL1, PALB2*, PHOX2B, PMS2*, POT1, PRKAR1A, PTCH1, PTEN*, RAD51C*, RAD51D*, RB1, RECQL, RET, SDHA, SDHAF2, SDHB, SDHC, SDHD, SMAD4, SMARCA4, SMARCB1, SMARCE1, STK11, SUFU, TMEM127, TP53*, TSC1, TSC2, VHL and XRCC2 (sequencing and deletion/duplication); EGFR, EGLN1, HOXB13, KIT, MITF, PDGFRA, POLD1, and POLE (sequencing only); EPCAM and GREM1 (deletion/duplication only). DNA and RNA analyses performed for * genes.   Based on [name]'s family history of cancer, she meets medical criteria for genetic testing. Despite that she meets criteria, she may still have an out of pocket cost. We discussed that if her out of pocket cost for testing is over $100, the laboratory will call and confirm whether she wants to proceed with testing.  If the out of pocket cost of testing is less than $100 she will be billed by the genetic testing laboratory.   PLAN: After considering the risks, benefits, and limitations, [name] provided informed consent to pursue genetic testing and the blood sample was sent to [facility] for analysis of the CancerNext-Expanded+RNAinsight. Results should be available within approximately 2-3 weeks' time, at which point they will be disclosed by telephone to [name], as will any additional recommendations warranted by  these results. [name] will receive a summary of her genetic counseling visit and a copy of her results once available. This information will also be available in Epic.   Lastly, we encouraged [name] to remain in contact with cancer genetics annually so that we can continuously update the family history and inform her of any  changes in cancer genetics and testing that may be of benefit for this family.   [name] questions were answered to her satisfaction today. Our contact information was provided should additional questions or concerns arise. Thank you for the referral and allowing [place] to share in the care of your patient.   Shayde Gervacio P. Florene Glen, [place], [facility] Licensed, Insurance risk surveyor [email or web address] phone: [phone]  The patient was seen for a total of 40 minutes in face-to-face genetic counseling.  The patient was seen alone.  This patient was discussed with Drs. Magrinat, Lindi Adie and/or Burr Medico who agrees with the above.    _______________________________________________________________________ For Office Staff:  Number of people involved in session: 1 Was an Intern/ student involved with case: no

## 2021-03-27 ENCOUNTER — Other Ambulatory Visit: Payer: Self-pay

## 2021-03-27 ENCOUNTER — Inpatient Hospital Stay: Payer: BC Managed Care – PPO

## 2021-04-11 ENCOUNTER — Telehealth: Payer: Self-pay | Admitting: Genetic Counselor

## 2021-04-11 ENCOUNTER — Encounter: Payer: Self-pay | Admitting: Genetic Counselor

## 2021-04-11 DIAGNOSIS — Z1379 Encounter for other screening for genetic and chromosomal anomalies: Secondary | ICD-10-CM | POA: Insufficient documentation

## 2021-04-11 NOTE — Telephone Encounter (Signed)
LM on VM that results are back and to please call. 

## 2021-04-15 ENCOUNTER — Ambulatory Visit: Payer: Self-pay | Admitting: Genetic Counselor

## 2021-04-15 DIAGNOSIS — Z1379 Encounter for other screening for genetic and chromosomal anomalies: Secondary | ICD-10-CM

## 2021-04-15 NOTE — Telephone Encounter (Signed)
Revealed negative genetic testing.  Discussed that we do not know why she has skin cancer or why there is cancer in the family. It could be due to a different gene that we are not testing, or maybe our current technology may not be able to pick something up.  It will be important for her to keep in contact with genetics to keep up with whether additional testing may be needed.

## 2021-04-15 NOTE — Progress Notes (Signed)
HPI:  Crystal Russo was previously seen in the [facility] clinic due to a family history of melanoma and breast cancer and concerns regarding a hereditary predisposition to cancer. Please refer to our prior cancer genetics clinic note for more information regarding our discussion, assessment and recommendations, at the time. Crystal Russo recent genetic test results were disclosed to her, as were recommendations warranted by these results. These results and recommendations are discussed in more detail below.  CANCER HISTORY:  Oncology History   No history exists.    FAMILY HISTORY:  We obtained a detailed, 4-generation family history.  Significant diagnoses are listed below: Family History  Problem Relation Age of Onset   Hypertension Mother    Heart disease Mother    Arthritis Mother    Berenice Primas' disease Mother    Thyroid cancer Mother    Fibroids Mother    Diverticulosis Mother    Basal cell carcinoma Mother    Melanoma Mother 49       3 melanomas in her lifetime   Ulcers Father    Hyperthyroidism Father    Arthritis Father    Congestive Heart Failure Father    Diverticulosis Father    Lung cancer Father    Fibroids Sister    Hypertension Sister    Fibromyalgia Sister    Heart disease Sister    Lupus Sister    Melanoma Brother 75   Hypertension Brother    Hyperlipidemia Brother    Skin cancer Brother    Congenital heart disease Brother    Stroke Brother    Basal cell carcinoma Brother    Hypertension Brother    Breast cancer Maternal Aunt 55   Breast cancer Maternal Aunt 59   Cancer Maternal Uncle        unknown dx   Stroke Maternal Grandmother    Stroke Maternal Grandfather    Dementia Paternal Grandmother    Liver cancer Paternal Grandfather    Melanoma Niece 14       brother's daughter      The patient has two daughters and a son who are cancer free.  She has one daughter who has had several BCC removed.  The patient ha three brothers and a sister.   One brother was diagnosed with melanoma at 76 and has had multiple BCC removed.  Another brother had BCC's and his daughter had melanoma at 47.  The patient's parents are both living.   The patient's mother had melanoma at ages 47, 56 and 55.  She also has had BCC's removed on her face and head. The patient's mother has two sisters and four brothers.  Both sisters had breast cancer one in her early 26's and another in her late 42's.  One uncle had an unknown cancer.  The maternal grandparents both died of strokes.   The patient's father was diagnosed with lung cancer at 58.  He has a brother and sister who were cancer free.  The paternal grandmother died with dementia, and his father died of liver cancer.   Crystal Russo is unaware of previous family history of genetic testing for hereditary cancer risks. Patient's maternal ancestors are of [place] and [place] descent, and paternal ancestors are of [place] descent. There is no reported Ashkenazi Jewish ancestry. There is no known consanguinity.  GENETIC TEST RESULTS: Genetic testing reported out on April 10, 2021 through the CancerNext-Expanded+RNAinsight cancer panel found no pathogenic mutations. The CancerNext-Expanded gene panel offered by [facility] and includes  sequencing and rearrangement analysis for the following 77 genes: AIP, ALK, APC*, ATM*, AXIN2, BAP1, BARD1, BLM, BMPR1A, BRCA1*, BRCA2*, BRIP1*, CDC73, CDH1*, CDK4, CDKN1B, CDKN2A, CHEK2*, CTNNA1, DICER1, FANCC, FH, FLCN, GALNT12, KIF1B, LZTR1, MAX, MEN1, MET, MLH1*, MSH2*, MSH3, MSH6*, MUTYH*, NBN, NF1*, NF2, NTHL1, PALB2*, PHOX2B, PMS2*, POT1, PRKAR1A, PTCH1, PTEN*, RAD51C*, RAD51D*, RB1, RECQL, RET, SDHA, SDHAF2, SDHB, SDHC, SDHD, SMAD4, SMARCA4, SMARCB1, SMARCE1, STK11, SUFU, TMEM127, TP53*, TSC1, TSC2, VHL and XRCC2 (sequencing and deletion/duplication); EGFR, EGLN1, HOXB13, KIT, MITF, PDGFRA, POLD1, and POLE (sequencing only); EPCAM and GREM1 (deletion/duplication only). DNA and RNA  analyses performed for * genes. The test report has been scanned into EPIC and is located under the Molecular Pathology section of the Results Review tab.  A portion of the result report is included below for reference.     We discussed with Crystal Russo that because current genetic testing is not perfect, it is possible there may be a gene mutation in one of these genes that current testing cannot detect, but that chance is small.  We also discussed, that there could be another gene that has not yet been discovered, or that we have not yet tested, that is responsible for the cancer diagnoses in the family. It is also possible there is a hereditary cause for the cancer in the family that Crystal Russo did not inherit and therefore was not identified in her testing.  Therefore, it is important to remain in touch with cancer genetics in the future so that we can continue to offer Crystal Russo the most up to date genetic testing.   ADDITIONAL GENETIC TESTING: We discussed with Crystal Russo that her genetic testing was fairly extensive.  If there are genes identified to increase cancer risk that can be analyzed in the future, we would be happy to discuss and coordinate this testing at that time.    CANCER SCREENING RECOMMENDATIONS: Crystal Russo test result is considered negative (normal).  This means that we have not identified a hereditary cause for her family history of melanoma and breast cancer at this time. Most cancers happen by chance and this negative test suggests that her cancer may fall into this category.    While reassuring, this does not definitively rule out a hereditary predisposition to cancer. It is still possible that there could be genetic mutations that are undetectable by current technology. There could be genetic mutations in genes that have not been tested or identified to increase cancer risk.  Therefore, it is recommended she continue to follow the cancer management and screening guidelines  provided by her primary healthcare provider.   An individual's cancer risk and medical management are not determined by genetic test results alone. Overall cancer risk assessment incorporates additional factors, including personal medical history, family history, and any available genetic information that may result in a personalized plan for cancer prevention and surveillance  Based on Crystal Russo's family of cancer, as well as her genetic test results, statistical models (Tyrer Cusik)  and literature data were used to estimate her risk of developing breast cancer. This estimates her lifetime risk of developing breast to be approximately 14%.  The patient's lifetime breast cancer risk is a preliminary estimate based on available information using one of several models endorsed by the [employer]  (NCCN).  The NCCN recommends consideration of breast MRI screening as an adjunct to mammography for patients at high risk (defined as 20% or greater lifetime risk). Please note that a woman's breast cancer risk  changes over time. It may increase or decrease based on age and any changes to the personal and/or family medical history. The risks and recommendations listed above apply to this patient at this point in time. In the future, she may or may not be eligible for the same medical management strategies and, in some cases, other medical management strategies may become available to her. If she is interested in an updated breast cancer risk assessment at a later date, she can contact us.     RECOMMENDATIONS FOR FAMILY MEMBERS:  Individuals in this family might be at some increased risk of developing cancer, over the general population risk, simply due to the family history of cancer.  We recommended women in this family have a yearly mammogram beginning at age 21, or 11 years younger than the earliest onset of cancer, an annual clinical breast exam, and perform monthly breast self-exams.  Women in this family should also have a gynecological exam as recommended by their primary provider. All family members should be referred for colonoscopy starting at age 88.  It is also possible there is a hereditary cause for the cancer in Crystal Russo's family that she did not inherit and therefore was not identified in her.  Based on Crystal Russo's family history, we recommended her mother, who was diagnosed with melanoma at age 12, 15 and 63, and a niece who had melanoma at 30, have genetic counseling and testing. Crystal Russo will let us know if we can be of any assistance in coordinating genetic counseling and/or testing for this family member.   FOLLOW-UP: Lastly, we discussed with Crystal Russo that cancer genetics is a rapidly advancing field and it is possible that new genetic tests will be appropriate for her and/or her family members in the future. We encouraged her to remain in contact with cancer genetics on an annual basis so we can update her personal and family histories and let her know of advances in cancer genetics that may benefit this family.   Our contact number was provided. Crystal Russo questions were answered to her satisfaction, and she knows she is welcome to call us at anytime with additional questions or concerns.   Roma Kayser, [place], [facility] Licensed, Certified Genetic Counselor [email or web address]

## 2021-05-05 ENCOUNTER — Other Ambulatory Visit (INDEPENDENT_AMBULATORY_CARE_PROVIDER_SITE_OTHER): Payer: Self-pay | Admitting: Nurse Practitioner

## 2021-05-30 ENCOUNTER — Ambulatory Visit (INDEPENDENT_AMBULATORY_CARE_PROVIDER_SITE_OTHER): Payer: BC Managed Care – PPO | Admitting: Vascular Surgery

## 2021-06-06 ENCOUNTER — Other Ambulatory Visit: Payer: Self-pay | Admitting: Family Medicine

## 2021-06-06 ENCOUNTER — Ambulatory Visit (INDEPENDENT_AMBULATORY_CARE_PROVIDER_SITE_OTHER): Payer: BC Managed Care – PPO | Admitting: Vascular Surgery

## 2021-06-06 DIAGNOSIS — E039 Hypothyroidism, unspecified: Secondary | ICD-10-CM

## 2021-06-06 NOTE — Telephone Encounter (Signed)
Requested medication (s) are due for refill today: yes ? ?Requested medication (s) are on the active medication list: yes ? ?Last refill:  06/11/20 #90 3 refills ? ?Future visit scheduled: yes in 6 months ? ?Notes to clinic:  protocol failed. Last TSH 12/07/2018 do you want to refill Rx? ? ? ?  ?Requested Prescriptions  ?Pending Prescriptions Disp Refills  ? levothyroxine (SYNTHROID) 150 MCG tablet [Pharmacy Med Name: L-THYROXINE (SYNTHROID) TABS 150MCG] 90 tablet 3  ?  Sig: TAKE 1 TABLET DAILY BEFORE BREAKFAST  ?  ? Endocrinology:  Hypothyroid Agents Failed - 06/06/2021  1:07 AM  ?  ?  Failed - TSH in normal range and within 360 days  ?  TSH  ?Date Value Ref Range Status  ?12/07/2018 0.132 (L) 0.450 - 4.500 uIU/mL Final  ?  ?  ?  ?  Passed - Valid encounter within last 12 months  ?  Recent Outpatient Visits   ? ?      ? 5 months ago Annual physical exam  ? [facility] Jerrol Banana., MD  ? 1 year ago Annual physical exam  ? [facility] Jerrol Ban[facility]., MD  ? 1 year ago DVT of deep femoral vein, left ([place])  ? [facility] Jerrol Banana., MD  ? 2 years ago Annual physical exam  ? [facility] Jerrol Banana., MD  ? 3 years ago Annual physical exam  ? [facility] Jerrol Banana., MD  ? ?  ?  ?Future Appointments   ? ?        ? In 6 months Jerrol Banana., MD [facility]  ? ?  ? ?  ?  ?  ? ?

## 2021-06-13 ENCOUNTER — Encounter (INDEPENDENT_AMBULATORY_CARE_PROVIDER_SITE_OTHER): Payer: Self-pay | Admitting: Vascular Surgery

## 2021-06-13 ENCOUNTER — Ambulatory Visit (INDEPENDENT_AMBULATORY_CARE_PROVIDER_SITE_OTHER): Payer: BC Managed Care – PPO | Admitting: Vascular Surgery

## 2021-06-13 DIAGNOSIS — I871 Compression of vein: Secondary | ICD-10-CM | POA: Diagnosis not present

## 2021-06-13 NOTE — Assessment & Plan Note (Signed)
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  I will plan to see her back in 1 year. ?

## 2021-06-13 NOTE — Progress Notes (Signed)
? ? ?MRN : [id number] ? ?Crystal Russo is a 61 y.o. (Feb 11, 1961) female who presents with chief complaint of No chief complaint on file. ?. ? 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  She was treated for this with percutaneous thrombolysis and thrombectomy with iliac stent placement about a year and a half ago.  She has been on anticoagulation since that time and tolerated this well.  She essentially has no leg swelling appreciable at this point.  She wears her compression socks every day and has many pairs that she rotates through.  These are working quite well for her.  She does still travel a lot and has long drives but she does try to stop and walk.  She reports no chest pain or shortness of breath.  No bleeding issues on anticoagulation. ? ?Current Outpatient Medications  ?Medication Sig Dispense Refill  ? Apremilast (OTEZLA) 30 MG TABS Take 1 tablet (30 mg total) by mouth 2 (two) times daily. 60 tablet 11  ? betamethasone dipropionate (DIPROLENE) 0.05 % ointment Apply topically 2 (two) times daily.     ? Calcipotriene 0.005 % FOAM Apply topically. Apply as needed on weekly on Saturday    ? cetirizine (ZYRTEC) 10 MG tablet Take 10 mg by mouth daily.    ? Cholecalciferol (VITAMIN D) 2000 units CAPS Take by mouth.    ? clidinium-chlordiazePOXIDE (LIBRAX) 5-2.5 MG capsule TAKE 1 CAPSULE TWICE A DAY AS NEEDED 180 capsule 3  ? Cyanocobalamin 5000 MCG/ML LIQD Place under the tongue. 1/2 dropper po QD    ? DULoxetine (CYMBALTA) 60 MG capsule TAKE 1 CAPSULE DAILY 90 capsule 3  ? ELIQUIS 5 MG TABS tablet TAKE 1 TABLET TWICE A DAY 180 tablet 3  ? estradiol (VIVELLE-DOT) 0.1 MG/24HR patch Place 1 patch onto the skin 2 (two) times a week.   12  ? fluticasone (FLONASE) 50 MCG/ACT nasal spray USE 2 SPRAYS IN THE NOSE DAILY AS DIRECTED (Patient taking differently: as needed.) 16 g 5  ? folic acid (FOLVITE) 1 MG tablet TAKE 1 TABLET DAILY  90 tablet 2  ? levothyroxine (SYNTHROID) 150 MCG tablet TAKE 1 TABLET DAILY BEFORE BREAKFAST 90 tablet 0  ? RABEprazole (ACIPHEX) 20 MG tablet TAKE 1 TABLET TWICE A DAY 180 tablet 3  ? topiramate (TOPAMAX) 50 MG tablet TAKE 1 TABLET DAILY 90 tablet 3  ? ?No current facility-administered medications for this visit.  ? ? ?Past Medical History:  ?Diagnosis Date  ? Cancer [facility])   ? skin cancer  ? Chronic kidney disease   ? improved post discontinuing NSAIDS  ? Hx of dysplastic nevus 10/13/2012  ? L proximal ant thigh, mild atypia  ? Hx of dysplastic nevus 10/13/2012  ? L dorsum foot, mild atypia  ? Hx of dysplastic nevus 10/22/2014  ? R ant lat ankle, mild atypia  ? Hypothyroidism   ? IBS (irritable bowel syndrome)   ? Mitral valve prolapse   ? ? ?Past Surgical History:  ?Procedure Laterality Date  ? ABDOMINAL HYSTERECTOMY    ? APPENDECTOMY    ? BREAST SURGERY    ? left nipple biopsy  ? MANDIBLE SURGERY    ? PERIPHERAL VASCULAR THROMBECTOMY Left 11/02/2019  ? Procedure: PERIPHERAL VASCULAR THROMBECTOMY / THROMBOLYSIS;  Surgeon: Algernon Huxley, MD;  Location: [facility];  Service: Cardiovascular;  Laterality: Left;  ? SHOULDER SURGERY    ? TUBAL LIGATION    ?  UPPER GI ENDOSCOPY  10/26/07  ? reflux esophagitis and a single gastric polyp  ? ? ? ?Social History  ? ?Tobacco Use  ? Smoking status: Never  ? Smokeless tobacco: Never  ?Vaping Use  ? Vaping Use: Never used  ?Substance Use Topics  ? Alcohol use: Yes  ?  Comment: once a month  ? Drug use: No  ? ? ? ? ?Family History  ?Problem Relation Age of Onset  ? Hypertension Mother   ? Heart disease Mother   ? Arthritis Mother   ? Graves' disease Mother   ? Thyroid cancer Mother   ? Fibroids Mother   ? Diverticulosis Mother   ? Basal cell carcinoma Mother   ? Melanoma Mother 36  ?     3 melanomas in her lifetime  ? Ulcers Father   ? Hyperthyroidism Father   ? Arthritis Father   ? Congestive Heart Failure Father   ? Diverticulosis Father   ? Lung cancer Father   ? Fibroids  Sister   ? Hypertension Sister   ? Fibromyalgia Sister   ? Heart disease Sister   ? Lupus Sister   ? Melanoma Brother 14  ? Hypertension Brother   ? Hyperlipidemia Brother   ? Skin cancer Brother   ? Congenital heart disease Brother   ? Stroke Brother   ? Basal cell carcinoma Brother   ? Hypertension Brother   ? Breast cancer Maternal Aunt 41  ? Breast cancer Maternal Aunt 79  ? Cancer Maternal Uncle   ?     unknown dx  ? Stroke Maternal Grandmother   ? Stroke Maternal Grandfather   ? Dementia Paternal Grandmother   ? Liver cancer Paternal Grandfather   ? Melanoma Niece 81  ?     brother's daughter  ? ? ?Allergies  ?Allergen Reactions  ? Nsaids   ? Other   ? ? ? ?REVIEW OF SYSTEMS (Negative unless checked) ? ?Constitutional: '[]'$ Weight loss  '[]'$ Fever  '[]'$ Chills ?Cardiac: '[]'$ Chest pain   '[]'$ Chest pressure   '[]'$ Palpitations   '[]'$ Shortness of breath when laying flat   '[]'$ Shortness of breath at rest   '[]'$ Shortness of breath with exertion. ?Vascular:  '[]'$ Pain in legs with walking   '[]'$ Pain in legs at rest   '[]'$ Pain in legs when laying flat   '[]'$ Claudication   '[]'$ Pain in feet when walking  '[]'$ Pain in feet at rest  '[]'$ Pain in feet when laying flat   '[x]'$ History of DVT   '[]'$ Phlebitis   '[x]'$ Swelling in legs   '[]'$ Varicose veins   '[]'$ Non-healing ulcers ?Pulmonary:   '[]'$ Uses home oxygen   '[]'$ Productive cough   '[]'$ Hemoptysis   '[]'$ Wheeze  '[]'$ COPD   '[]'$ Asthma ?Neurologic:  '[]'$ Dizziness  '[]'$ Blackouts   '[]'$ Seizures   '[]'$ History of stroke   '[]'$ History of TIA  '[]'$ Aphasia   '[]'$ Temporary blindness   '[]'$ Dysphagia   '[]'$ Weakness or numbness in arms   '[]'$ Weakness or numbness in legs ?Musculoskeletal:  '[x]'$ Arthritis   '[]'$ Joint swelling   '[]'$ Joint pain   '[]'$ Low back pain ?Hematologic:  '[]'$ Easy bruising  '[]'$ Easy bleeding   '[]'$ Hypercoagulable state   '[]'$ Anemic   ?Gastrointestinal:  '[]'$ Blood in stool   '[]'$ Vomiting blood  '[]'$ Gastroesophageal reflux/heartburn   '[]'$ Abdominal pain ?Genitourinary:  '[]'$ Chronic kidney disease   '[]'$ Difficult urination  '[]'$ Frequent urination  '[]'$ Burning with urination    '[]'$ Hematuria ?Skin:  '[]'$ Rashes   '[]'$ Ulcers   '[]'$ Wounds ?Psychological:  '[]'$ History of anxiety   '[]'$  History of major depression. ? ?Physical Examination ? ?BP 115/64 (BP Location: Right  Arm)   Pulse 77   Resp 17   Ht '5\' 9"'$  (1.753 m)   Wt 179 lb (81.2 kg)   BMI 26.43 kg/m?  ?Gen:  WD/WN, NAD ?Head: [place]/AT, No temporalis wasting. ?Ear/Nose/Throat: Hearing grossly intact, nares w/o erythema or drainage ?Eyes: Conjunctiva clear. Sclera non-icteric ?Neck: Supple.  Trachea midline ?Pulmonary:  Good air movement, no use of accessory muscles.  ?Cardiac: RRR, no JVD ?Vascular:  ?Vessel Right Left  ?Radial Palpable Palpable  ?    ?    ?    ?    ?    ?    ?PT Palpable Palpable  ?DP Palpable Palpable  ? ?Musculoskeletal: M/S 5/5 throughout.  No deformity or atrophy.  No appreciable edema. ?Neurologic: Sensation grossly intact in extremities.  Symmetrical.  Speech is fluent.  ?Psychiatric: Judgment intact, Mood & affect appropriate for pt's clinical situation. ?Dermatologic: No rashes or ulcers noted.  No cellulitis or open wounds. ? ? ? ? ? ?Labs ?No results found for this or any previous visit (from the past 2160 hour(s)). ? ?Radiology ?No results found. ? ?Assessment/Plan ? ?May-Thurner syndrome ?Doing well status post intervention from May Thurner syndrome about a year and a half ago.  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  I will plan to see her back in 1 year. ? ? ? ?Leotis Pain, MD ? ?06/13/2021 ?10:59 AM ? ? ? ?This note was created with Dragon medical transcription system.  Any errors from dictation are purely unintentional  ?

## 2021-08-27 ENCOUNTER — Other Ambulatory Visit: Payer: Self-pay | Admitting: Family Medicine

## 2021-08-27 DIAGNOSIS — D649 Anemia, unspecified: Secondary | ICD-10-CM

## 2021-09-04 ENCOUNTER — Other Ambulatory Visit: Payer: Self-pay | Admitting: Family Medicine

## 2021-09-04 DIAGNOSIS — E039 Hypothyroidism, unspecified: Secondary | ICD-10-CM

## 2021-09-04 NOTE — Telephone Encounter (Signed)
Requested medication (s) are due for refill today: yes  Requested medication (s) are on the active medication list: yes    Last refill: 06/06/21  #90  0 refills  Future visit scheduled yes 12/29/21  Notes to clinic: Failed due to labs, please review. Thank you.  Requested Prescriptions  Pending Prescriptions Disp Refills   levothyroxine (SYNTHROID) 150 MCG tablet [Pharmacy Med Name: L-THYROXINE (SYNTHROID) TABS 150MCG] 90 tablet 3    Sig: TAKE 1 TABLET DAILY BEFORE BREAKFAST (NEED LABS BEFORE NEXT REFILL IS DUE)     Endocrinology:  Hypothyroid Agents Failed - 09/04/2021 12:58 AM      Failed - TSH in normal range and within 360 days    TSH  Date Value Ref Range Status  12/07/2018 0.132 (L) 0.450 - 4.500 uIU/mL Final         Passed - Valid encounter within last 12 months    Recent Outpatient Visits           8 months ago Annual physical exam   [facility] Jerrol Banana., MD   1 year ago Annual physical exam   [facility] Jerrol Banana., MD   1 year ago DVT of deep femoral vein, left [facility])   [facility] Jerrol Banana., MD   2 years ago Annual physical exam   [facility] Jerrol Banana., MD   3 years ago Annual physical exam   [facility] Jerrol Banana., MD       Future Appointments             In 3 months Jerrol Banana., MD [facility], [place]

## 2021-09-23 ENCOUNTER — Other Ambulatory Visit: Payer: Self-pay | Admitting: Family Medicine

## 2021-09-23 DIAGNOSIS — E039 Hypothyroidism, unspecified: Secondary | ICD-10-CM

## 2021-10-05 ENCOUNTER — Other Ambulatory Visit: Payer: Self-pay | Admitting: Nurse Practitioner

## 2021-10-05 DIAGNOSIS — J0110 Acute frontal sinusitis, unspecified: Secondary | ICD-10-CM

## 2021-10-05 MED ORDER — AZITHROMYCIN 250 MG PO TABS: ORAL_TABLET | ORAL | 0 refills | Status: AC

## 2021-11-18 ENCOUNTER — Telehealth: Payer: Self-pay | Admitting: Family Medicine

## 2021-11-18 DIAGNOSIS — F320 Major depressive disorder, single episode, mild: Secondary | ICD-10-CM

## 2021-11-19 NOTE — Telephone Encounter (Signed)
last RF 01/06/21 #90 3 RF  Requested Prescriptions  Refused Prescriptions Disp Refills  . DULoxetine (CYMBALTA) 60 MG capsule [Pharmacy Med Name: DULOXETINE HCL DR CAPS 60MG] 90 capsule 3    Sig: TAKE 1 CAPSULE DAILY     Psychiatry: Antidepressants - SNRI - duloxetine Failed - 11/18/2021 11:31 PM      Failed - Cr in normal range and within 360 days    Creatinine, Ser  Date Value Ref Range Status  12/26/2020 1.03 (H) 0.57 - 1.00 mg/dL Final         Failed - Valid encounter within last 6 months    Recent Outpatient Visits          10 months ago Annual physical exam   [facility] Jerrol Banana., MD   1 year ago Annual physical exam   [facility] Jerrol Banana., MD   2 years ago DVT of deep femoral vein, left [facility])   [facility] Jerrol Banana., MD   2 years ago Annual physical exam   [facility] Jerrol Banana., MD   4 years ago Annual physical exam   [facility] Jerrol Banana., MD      Future Appointments            In 1 month Jerrol Banana., MD [facility] [place] - eGFR is 30 or above and within 360 days    GFR calc Af Amer  Date Value Ref Range Status  03/06/2020 74 >59 mL/min/1.73 Final    Comment:    **In accordance with recommendations from the NKF-ASN Task force,**   [facility] is in the process of updating its eGFR calculation to the   2021 CKD-EPI creatinine equation that estimates kidney function   without a race variable.    GFR calc non Af Amer  Date Value Ref Range Status  03/06/2020 64 >59 mL/min/1.73 Final   eGFR  Date Value Ref Range Status  12/26/2020 63 >59 mL/min/1.73 Final         Passed - Completed PHQ-2 or PHQ-9 in the last 360 days      Passed - Last BP in normal range    BP Readings from Last 1 Encounters:  06/13/21 115/64

## 2021-11-24 MED ORDER — DULOXETINE HCL 60 MG PO CPEP: 60.0000 mg | ORAL_CAPSULE | Freq: Every day | ORAL | 0 refills | Status: AC

## 2021-11-24 NOTE — Telephone Encounter (Signed)
Refills for duloxetine sent to pharmacy  Eulis Foster, MD  [facility]  [phone]

## 2021-11-24 NOTE — Telephone Encounter (Signed)
Please Review

## 2021-11-24 NOTE — Telephone Encounter (Signed)
Pt called to reschedule from Dr. Rosanna Randy to Dr. Quentin Cornwall on 11.15.23 for her CPE/ please advise and [facility]  [facility]

## 2021-11-25 ENCOUNTER — Other Ambulatory Visit: Payer: Self-pay | Admitting: Family Medicine

## 2021-11-25 DIAGNOSIS — Z8669 Personal history of other diseases of the nervous system and sense organs: Secondary | ICD-10-CM

## 2021-12-22 ENCOUNTER — Encounter (INDEPENDENT_AMBULATORY_CARE_PROVIDER_SITE_OTHER): Payer: Self-pay

## 2021-12-29 ENCOUNTER — Encounter: Payer: BC Managed Care – PPO | Admitting: Family Medicine

## 2022-01-07 ENCOUNTER — Encounter: Payer: BC Managed Care – PPO | Admitting: Family Medicine

## 2022-02-23 ENCOUNTER — Other Ambulatory Visit: Payer: Self-pay | Admitting: Family Medicine

## 2022-02-23 DIAGNOSIS — Z8669 Personal history of other diseases of the nervous system and sense organs: Secondary | ICD-10-CM

## 2022-02-23 NOTE — Telephone Encounter (Signed)
Also requesting RABEprazole (ACIPHEX) 20 MG tablet

## 2022-02-25 ENCOUNTER — Ambulatory Visit (INDEPENDENT_AMBULATORY_CARE_PROVIDER_SITE_OTHER): Payer: BC Managed Care – PPO | Admitting: Dermatology

## 2022-02-25 ENCOUNTER — Encounter: Payer: Self-pay | Admitting: Dermatology

## 2022-02-25 DIAGNOSIS — Z86018 Personal history of other benign neoplasm: Secondary | ICD-10-CM

## 2022-02-25 DIAGNOSIS — L409 Psoriasis, unspecified: Secondary | ICD-10-CM | POA: Diagnosis not present

## 2022-02-25 DIAGNOSIS — L821 Other seborrheic keratosis: Secondary | ICD-10-CM

## 2022-02-25 DIAGNOSIS — L603 Nail dystrophy: Secondary | ICD-10-CM

## 2022-02-25 DIAGNOSIS — Z1283 Encounter for screening for malignant neoplasm of skin: Secondary | ICD-10-CM

## 2022-02-25 DIAGNOSIS — Z808 Family history of malignant neoplasm of other organs or systems: Secondary | ICD-10-CM

## 2022-02-25 DIAGNOSIS — L814 Other melanin hyperpigmentation: Secondary | ICD-10-CM

## 2022-02-25 NOTE — Patient Instructions (Addendum)
Recommend using Cutemol to nails/cuticles as directed on label.   Recommend daily broad spectrum sunscreen SPF 30+ to sun-exposed areas, reapply every 2 hours as needed. Call for new or changing lesions.  Staying in the shade or wearing long sleeves, sun glasses (UVA+UVB protection) and wide brim hats (4-inch brim around the entire circumference of the hat) are also recommended for sun protection.    Recommend taking Heliocare sun protection supplement daily in sunny weather for additional sun protection. For maximum protection on the sunniest days, you can take up to 2 capsules of regular Heliocare OR take 1 capsule of Heliocare Ultra. For prolonged exposure (such as a full day in the sun), you can repeat your dose of the supplement 4 hours after your first dose. Heliocare can be purchased at [employer], at some [facility] or at [email or web address].    Melanoma ABCDEs  Melanoma is the most dangerous type of skin cancer, and is the leading cause of death from skin disease.  You are more likely to develop melanoma if you: Have light-colored skin, light-colored eyes, or red or blond hair Spend a lot of time in the sun Tan regularly, either outdoors or in a tanning bed Have had blistering sunburns, especially during childhood Have a close family member who has had a melanoma Have atypical moles or large birthmarks  Early detection of melanoma is key since treatment is typically straightforward and cure rates are extremely high if we catch it early.   The first sign of melanoma is often a change in a mole or a new dark spot.  The ABCDE system is a way of remembering the signs of melanoma.  A for asymmetry:  The two halves do not match. B for border:  The edges of the growth are irregular. C for color:  A mixture of colors are present instead of an even brown color. D for diameter:  Melanomas are usually (but not always) greater than 63m - the size of a pencil eraser. E for evolution:   The spot keeps changing in size, shape, and color.  Please check your skin once per month between visits. You can use a small mirror in front and a large mirror behind you to keep an eye on the back side or your body.   If you see any new or changing lesions before your next follow-up, please call to schedule a visit.  Please continue daily skin protection including broad spectrum sunscreen SPF 30+ to sun-exposed areas, reapplying every 2 hours as needed when you're outdoors.   Staying in the shade or wearing long sleeves, sun glasses (UVA+UVB protection) and wide brim hats (4-inch brim around the entire circumference of the hat) are also recommended for sun protection.    Due to recent changes in healthcare laws, you may see results of your pathology and/or laboratory studies on MyChart before the doctors have had a chance to review them. We understand that in some cases there may be results that are confusing or concerning to you. Please understand that not all results are received at the same time and often the doctors may need to interpret multiple results in order to provide you with the best plan of care or course of treatment. Therefore, we ask that you please give u[place]2 business days to thoroughly review all your results before contacting the office for clarification. Should we see a critical lab result, you will be contacted sooner.   If You Need Anything After Your Visit  If you have any questions or concerns for your doctor, please call our main line at [phone] and press option 4 to reach your doctor's medical assistant. If no one answers, please leave a voicemail as directed and we will return your call as soon as possible. Messages left after 4 pm will be answered the following business day.   You may also send [place] a message via [place]. We typically respond to MyChart messages within 1-2 business days.  For prescription refills, please ask your pharmacy to contact our office. Our  fax number is [phone].  If you have an urgent issue when the clinic is closed that cannot wait until the next business day, you can page your doctor at the number below.    Please note that while we do our best to be available for urgent issues outside of office hours, we are not available 24/7.   If you have an urgent issue and are unable to reach [place], you may choose to seek medical care at your doctor's office, retail clinic, urgent care center, or emergency room.  If you have a medical emergency, please immediately call 911 or go to the emergency department.  Pager Numbers  - Dr. Nehemiah Massed: [phone]  - Dr. Laurence Ferrari: [phone]  - Dr. Nicole Kindred: [phone]  In the event of inclement weather, please call our main line at [phone] for an update on the status of any delays or closures.  Dermatology Medication Tips: Please keep the boxes that topical medications come in in order to help keep track of the instructions about where and how to use these. Pharmacies typically print the medication instructions only on the boxes and not directly on the medication tubes.   If your medication is too expensive, please contact our office at [phone] option 4 or send [place] a message through [place].   We are unable to tell what your co-pay for medications will be in advance as this is different depending on your insurance coverage. However, we may be able to find a substitute medication at lower cost or fill out paperwork to get insurance to cover a needed medication.   If a prior authorization is required to get your medication covered by your insurance company, please allow [place] 1-2 business days to complete this process.  Drug prices often vary depending on where the prescription is filled and some pharmacies may offer cheaper prices.  The website www.goodrx.com contains coupons for medications through different pharmacies. The prices here do not account for what the cost may be with help  from insurance (it may be cheaper with your insurance), but the website can give you the price if you did not use any insurance.  - You can print the associated coupon and take it with your prescription to the pharmacy.  - You may also stop by our office during regular business hours and pick up a GoodRx coupon card.  - If you need your prescription sent electronically to a different pharmacy, notify our office through [facility] or by phone at [phone] option 4.     Si Usted Necesita Algo Despus de Su Visita  Tambin puede enviarnos un mensaje a travs de Pharmacist, community. Por lo general respondemos a los mensajes de MyChart en el transcurso de 1 a 2 das hbiles.  Para renovar recetas, por favor pida a su farmacia que se ponga en contacto con nuestra oficina. Harland Dingwall de fax es [place] [phone].  Si tiene un asunto urgente cuando la clnica est  cerrada y que no puede esperar hasta el siguiente da hbil, puede llamar/localizar a su doctor(a) al nmero que aparece a continuacin.   Por favor, tenga en cuenta que aunque hacemos todo lo posible para estar disponibles para asuntos urgentes fuera del horario de [place], no estamos disponibles las 24 horas del da, los 7 das de la [place].   Si tiene un problema urgente y no puede comunicarse con nosotros, puede optar por buscar atencin mdica  en el consultorio de su doctor(a), en una clnica privada, en un centro de atencin urgente o en una sala de emergencias.  Si tiene Engineering geologist, por favor llame inmediatamente al 911 o vaya a la sala de emergencias.  Nmeros de bper  - Dr. Nehemiah Massed: [phone]  - Dra. Moye: [phone]  - Dra. Nicole Kindred: [phone]  En caso de inclemencias del [place], por favor llame a Johnsie Kindred principal al [phone] para una actualizacin sobre el [place] de cualquier retraso o cierre.  Consejos para la medicacin en dermatologa: Por favor, guarde las cajas en las que vienen los  medicamentos de uso tpico para ayudarle a seguir las instrucciones sobre dnde y cmo usarlos. Las farmacias generalmente imprimen las instrucciones del medicamento slo en las cajas y no directamente en los tubos del [place].   Si su medicamento es muy caro, por favor, pngase en contacto con Zigmund Daniel llamando al [phone] y presione la opcin 4 o envenos un mensaje a travs de Pharmacist, community.   No podemos decirle cul ser su copago por los medicamentos por adelantado ya que esto es diferente dependiendo de la cobertura de su seguro. Sin embargo, es posible que podamos encontrar un medicamento sustituto a Electrical engineer un formulario para que el seguro cubra el medicamento que se considera necesario.   Si se requiere una autorizacin previa para que su compaa de seguros [place] su medicamento, por favor permtanos de 1 a 2 das hbiles para completar este proceso.  Los precios de los medicamentos varan con frecuencia dependiendo del Environmental consultant de dnde se surte la receta y alguna farmacias pueden ofrecer precios ms baratos.  El sitio web www.goodrx.com tiene cupones para medicamentos de Airline pilot. Los precios aqu no tienen en cuenta lo que podra costar con la ayuda del seguro (puede ser ms barato con su seguro), pero el sitio web puede darle el precio si no utiliz Research scientist (physical sciences).  - Puede imprimir el cupn correspondiente y llevarlo con su receta a la farmacia.  - Tambin puede pasar por nuestra oficina durante el horario de atencin regular y Charity fundraiser una tarjeta de cupones de GoodRx.  - Si necesita que su receta se enve electrnicamente a una farmacia diferente, informe a nuestra oficina a travs de MyChart de [place] o por telfono llamando al [phone] y presione la opcin 4.

## 2022-02-25 NOTE — Progress Notes (Signed)
Follow-Up Visit   Subjective  Crystal Russo is a 62 y.o. female who presents for the following: Annual Exam (Hx of dysplastic nevi) and Psoriasis (Left foot. Stopped [employer], insurance would not cover. Has samples of Vtama and Zoryve but has not had to use. Has not flared since stopping [place]).  The patient presents for Total-Body Skin Exam (TBSE) for skin cancer screening and mole check.  The patient has spots, moles and lesions to be evaluated, some may be new or changing and the patient has concerns that these could be cancer.  The following portions of the chart were reviewed this encounter and updated as appropriate:  Tobacco  Allergies  Meds  Problems  Med Hx  Surg Hx  Fam Hx      Review of Systems: No other skin or systemic complaints except as noted in HPI or Assessment and Plan.   Objective  Well appearing patient in no apparent distress; mood and affect are within normal limits.  A full examination was performed including scalp, head, eyes, ears, nose, lips, neck, chest, axillae, abdomen, back, buttocks, bilateral upper extremities, bilateral lower extremities, hands, feet, fingers, toes, fingernails, and toenails. All findings within normal limits unless otherwise noted below.  plantar feet Xerosis at plantar feet  fingernails Unable to view on clinical exam due to nail polish.   Assessment & Plan   History of Dysplastic Nevi - No evidence of recurrence today - Recommend regular full body skin exams - Recommend daily broad spectrum sunscreen SPF 30+ to sun-exposed areas, reapply every 2 hours as needed.  - Call if any new or changing lesions are noted between office visits   Family history of skin cancer - what type(s): Melanoma - who affected: Mother, niece, brother  Lentigines - Scattered tan macules - Due to sun exposure - Benign-appearing, observe - Recommend daily broad spectrum sunscreen SPF 30+ to sun-exposed areas, reapply every 2 hours as  needed. - Call for any changes  Seborrheic Keratoses - Stuck-on, waxy, tan-brown papules and/or plaques  - Benign-appearing - Discussed benign etiology and prognosis. - Observe - Call for any changes  Melanocytic Nevi - Tan-brown and/or pink-flesh-colored symmetric macules and papules - Benign appearing on exam today - Observation - Call clinic for new or changing moles - Recommend daily use of broad spectrum spf 30+ sunscreen to sun-exposed areas.  - Check nails when remove polish.   Hemangiomas - Red papules - Discussed benign nature - Observe - Call for any changes  Actinic Damage - Chronic condition, secondary to cumulative UV/sun exposure - diffuse scaly erythematous macules with underlying dyspigmentation - Recommend daily broad spectrum sunscreen SPF 30+ to sun-exposed areas, reapply every 2 hours as needed.  - Staying in the shade or wearing long sleeves, sun glasses (UVA+UVB protection) and wide brim hats (4-inch brim around the entire circumference of the hat) are also recommended for sun protection.  - Call for new or changing lesions.  Skin cancer screening performed today.  Dermatofibroma. Left lateral calf - Firm pink/brown papulenodule with dimple sign - Benign appearing - Call for any changes  Sebaceous Hyperplasia. Face - Small yellow papules with a central dell - Benign - Observe   Psoriasis plantar feet  Chronic condition with duration or expected duration over one year. Currently well-controlled.  Counseling on psoriasis and coordination of care  psoriasis is a chronic non-curable, but treatable genetic/hereditary disease that may have other systemic features affecting other organ systems such as joints (Psoriatic Arthritis). It  is associated with an increased risk of inflammatory bowel disease, heart disease, non-alcoholic fatty liver disease, and depression.  Treatments include light and laser treatments; topical medications; and systemic  medications including oral and injectables.  Currently staying clear without treatment.   Recommend using Vaseline Jelly or Aquaphor at bedtime to feet for xerosis. Use Vtama and Zoryve as needed if she flares.  No joint pain.  Related Medications Apremilast (OTEZLA) 30 MG TABS Take 1 tablet (30 mg total) by mouth 2 (two) times daily.  Onychodystrophy fingernails  Recommend using Cutemol to nails/cuticles as directed on label. Recheck if not resolving.   Return in about 1 year (around 02/26/2023) for TBSE, HxDN, Fm Hx MM.  I, Emelia Salisbury, CMA, am acting as scribe for Forest Gleason, MD.  Documentation: I have reviewed the above documentation for accuracy and completeness, and I agree with the above.  Forest Gleason, MD

## 2022-03-02 ENCOUNTER — Telehealth: Payer: Self-pay | Admitting: Family Medicine

## 2022-03-02 DIAGNOSIS — K219 Gastro-esophageal reflux disease without esophagitis: Secondary | ICD-10-CM

## 2022-03-02 MED ORDER — RABEPRAZOLE SODIUM 20 MG PO TBEC: 20.0000 mg | DELAYED_RELEASE_TABLET | Freq: Two times a day (BID) | ORAL | 1 refills | Status: AC

## 2022-03-02 NOTE — Telephone Encounter (Signed)
[facility] faxed refill request for the following medications:   RABEprazole (ACIPHEX) 20 MG tablet    Please advise

## 2022-03-03 ENCOUNTER — Other Ambulatory Visit: Payer: Self-pay | Admitting: Family Medicine

## 2022-03-03 DIAGNOSIS — K589 Irritable bowel syndrome without diarrhea: Secondary | ICD-10-CM

## 2022-03-10 ENCOUNTER — Encounter: Payer: Self-pay | Admitting: Dermatology

## 2022-03-27 ENCOUNTER — Telehealth: Payer: BC Managed Care – PPO | Admitting: Physician Assistant

## 2022-03-27 DIAGNOSIS — J019 Acute sinusitis, unspecified: Secondary | ICD-10-CM | POA: Diagnosis not present

## 2022-03-27 DIAGNOSIS — B9689 Other specified bacterial agents as the cause of diseases classified elsewhere: Secondary | ICD-10-CM

## 2022-03-27 MED ORDER — AZITHROMYCIN 250 MG PO TABS: ORAL_TABLET | ORAL | 0 refills | Status: AC

## 2022-03-27 NOTE — Progress Notes (Signed)

## 2022-04-02 ENCOUNTER — Telehealth (INDEPENDENT_AMBULATORY_CARE_PROVIDER_SITE_OTHER): Payer: Self-pay

## 2022-04-02 MED ORDER — APIXABAN 5 MG PO TABS: 5.0000 mg | ORAL_TABLET | Freq: Two times a day (BID) | ORAL | 3 refills | Status: DC

## 2022-04-02 NOTE — Telephone Encounter (Signed)
Patients insurance has changed therefore requiring the use of a different pharmacy.  She request eliquis be sent to [facility].  New rx sent.  Patient aware.

## 2022-06-16 ENCOUNTER — Encounter (INDEPENDENT_AMBULATORY_CARE_PROVIDER_SITE_OTHER): Payer: Self-pay | Admitting: Vascular Surgery

## 2022-06-16 ENCOUNTER — Ambulatory Visit (INDEPENDENT_AMBULATORY_CARE_PROVIDER_SITE_OTHER): Payer: BC Managed Care – PPO | Admitting: Vascular Surgery

## 2022-06-16 VITALS — BP 106/69 | HR 108 | Resp 18 | Ht 68.5 in | Wt 185.6 lb

## 2022-06-16 DIAGNOSIS — I871 Compression of vein: Secondary | ICD-10-CM

## 2022-06-16 NOTE — Assessment & Plan Note (Signed)
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  She is doing well.  She wears her compression socks every day and has really had complete resolution of her swelling at this point.  She is tolerating anticoagulation without any signs of bleeding.  We will continue anticoagulation indefinitely due to the high risk nature after her venous intervention.  Contact our office with any problems but otherwise follow-up in 1 year.

## 2022-06-16 NOTE — Progress Notes (Signed)
MRN : [id number]  Crystal Russo is a 62 y.o. (06/03/1960) female who presents with chief complaint of  Chief Complaint  Patient presents with   Follow-up    f/u in 1 year with no studies  .  History of Present Illness: Patient returns today in follow up of her previous DVT and May Thurner syndrome.  She is doing well.  She wears her compression socks every day and has no swelling.  She has no bleeding issues on her Eliquis.  She has no specific complaints today.  Her intervention was over 2 years ago at this point.  Current Outpatient Medications  Medication Sig Dispense Refill   apixaban (ELIQUIS) 5 MG TABS tablet Take 1 tablet (5 mg total) by mouth 2 (two) times daily. 180 tablet 3   Apremilast (OTEZLA) 30 MG TABS Take 1 tablet (30 mg total) by mouth 2 (two) times daily. 60 tablet 11   betamethasone dipropionate (DIPROLENE) 0.05 % ointment Apply topically 2 (two) times daily.      Calcipotriene 0.005 % FOAM Apply topically. Apply as needed on weekly on Saturday     cetirizine (ZYRTEC) 10 MG tablet Take 10 mg by mouth daily.     Cholecalciferol (VITAMIN D) 2000 units CAPS Take by mouth.     clidinium-chlordiazePOXIDE (LIBRAX) 5-2.5 MG capsule TAKE 1 CAPSULE TWICE A DAY AS NEEDED 180 capsule 1   Cyanocobalamin 5000 MCG/ML LIQD Place under the tongue. 1/2 dropper po QD     DULoxetine (CYMBALTA) 60 MG capsule Take 1 capsule (60 mg total) by mouth daily. 90 capsule 0   estradiol (VIVELLE-DOT) 0.1 MG/24HR patch Place 1 patch onto the skin 2 (two) times a week.   12   fluticasone (FLONASE) 50 MCG/ACT nasal spray USE 2 SPRAYS IN THE NOSE DAILY AS DIRECTED (Patient taking differently: as needed.) 16 g 5   folic acid (FOLVITE) 1 MG tablet TAKE 1 TABLET DAILY 90 tablet 3   levothyroxine (SYNTHROID) 150 MCG tablet TAKE 1 TABLET DAILY BEFORE BREAKFAST (NEED LABS BEFORE NEXT REFILL IS DUE) 30 tablet 11   RABEprazole (ACIPHEX) 20 MG tablet Take 1 tablet (20 mg total) by mouth 2 (two) times daily.  180 tablet 1   topiramate (TOPAMAX) 50 MG tablet TAKE 1 TABLET DAILY 90 tablet 0   XDEMVY 0.25 % SOLN Apply to eye.     No current facility-administered medications for this visit.    Past Medical History:  Diagnosis Date   Cancer    skin cancer   Chronic kidney disease    improved post discontinuing NSAIDS   Hx of dysplastic nevus 10/13/2012   L proximal ant thigh, mild atypia   Hx of dysplastic nevus 10/13/2012   L dorsum foot, mild atypia   Hx of dysplastic nevus 10/22/2014   R ant lat ankle, mild atypia   Hypothyroidism    IBS (irritable bowel syndrome)    Mitral valve prolapse     Past Surgical History:  Procedure Laterality Date   ABDOMINAL HYSTERECTOMY     APPENDECTOMY     BREAST SURGERY     left nipple biopsy   MANDIBLE SURGERY     PERIPHERAL VASCULAR THROMBECTOMY Left 11/02/2019   Procedure: PERIPHERAL VASCULAR THROMBECTOMY / THROMBOLYSIS;  Surgeon: Annice Needy, MD;  Location: [facility] INVASIVE CV LAB;  Service: Cardiovascular;  Laterality: Left;   SHOULDER SURGERY     TUBAL LIGATION     UPPER GI ENDOSCOPY  10/26/07   reflux esophagitis  and a single gastric polyp     Social History   Tobacco Use   Smoking status: Never   Smokeless tobacco: Never  Vaping Use   Vaping Use: Never used  Substance Use Topics   Alcohol use: Yes    Comment: once a month   Drug use: No       Family History  Problem Relation Age of Onset   Hypertension Mother    Heart disease Mother    Arthritis Mother    Luiz Blare' disease Mother    Thyroid cancer Mother    Fibroids Mother    Diverticulosis Mother    Basal cell carcinoma Mother    Melanoma Mother 6       3 melanomas in her lifetime   Ulcers Father    Hyperthyroidism Father    Arthritis Father    Congestive Heart Failure Father    Diverticulosis Father    Lung cancer Father    Fibroids Sister    Hypertension Sister    Fibromyalgia Sister    Heart disease Sister    Lupus Sister    Melanoma Brother 16   Hypertension  Brother    Hyperlipidemia Brother    Skin cancer Brother    Congenital heart disease Brother    Stroke Brother    Basal cell carcinoma Brother    Hypertension Brother    Breast cancer Maternal Aunt 55   Breast cancer Maternal Aunt 18   Cancer Maternal Uncle        unknown dx   Stroke Maternal Grandmother    Stroke Maternal Grandfather    Dementia Paternal Grandmother    Liver cancer Paternal Grandfather    Melanoma Niece 44       brother's daughter     Allergies  Allergen Reactions   Nsaids    Other      REVIEW OF SYSTEMS (Negative unless checked)  Constitutional: Weight loss  Fever  Chills Cardiac: Chest pain   Chest pressure   Palpitations   Shortness of breath when laying flat   Shortness of breath at rest   Shortness of breath with exertion. Vascular:  Pain in legs with walking   Pain in legs at rest   Pain in legs when laying flat   Claudication   Pain in feet when walking  Pain in feet at rest  Pain in feet when laying flat   History of DVT   Phlebitis   Swelling in legs   Varicose veins   Non-healing ulcers Pulmonary:   Uses home oxygen   Productive cough   Hemoptysis   Wheeze  COPD   Asthma Neurologic:  Dizziness  Blackouts   Seizures   History of stroke   History of TIA  Aphasia   Temporary blindness   Dysphagia   Weakness or numbness in arms   Weakness or numbness in legs Musculoskeletal:  Arthritis   Joint swelling   Joint pain   Low back pain Hematologic:  Easy bruising  Easy bleeding   Hypercoagulable state   Anemic   Gastrointestinal:  Blood in stool   Vomiting blood  Gastroesophageal reflux/heartburn   Abdominal pain Genitourinary:  Chronic kidney disease   Difficult urination  Frequent urination  Burning with urination   Hematuria Skin:  Rashes   Ulcers   Wounds Psychological:  History of anxiety    History of major depression.  Physical  Examination  BP 106/69 (BP Location: Right Arm)   Pulse (!) 108  Resp 18   Ht 5' 8.5" (1.74 m)   Wt 185 lb 9.6 oz (84.2 kg)   BMI 27.81 kg/m  Gen:  WD/WN, NAD. Appears younger than stated age. Head: [place]/AT, No temporalis wasting. Ear/Nose/Throat: Hearing grossly intact, nares w/o erythema or drainage Eyes: Conjunctiva clear. Sclera non-icteric Neck: Supple.  Trachea midline Pulmonary:  Good air movement, no use of accessory muscles.  Cardiac: RRR, no JVD Vascular:  Vessel Right Left  Radial Palpable Palpable                          PT Palpable Palpable  DP Palpable Palpable   Gastrointestinal: soft, non-tender/non-distended. No guarding/reflex.  Musculoskeletal: M/S 5/5 throughout.  No deformity or atrophy. No edema. Neurologic: Sensation grossly intact in extremities.  Symmetrical.  Speech is fluent.  Psychiatric: Judgment intact, Mood & affect appropriate for pt's clinical situation. Dermatologic: No rashes or ulcers noted.  No cellulitis or open wounds.      Labs No results found for this or any previous visit (from the past 2160 hour(s)).  Radiology No results found.  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  She is doing well.  She wears her compression socks every day and has really had complete resolution of her swelling at this point.  She is tolerating anticoagulation without any signs of bleeding.  We will continue anticoagulation indefinitely due to the high risk nature after her venous intervention.  Contact our office with any problems but otherwise follow-up in 1 year.    Festus Barren, MD  06/16/2022 11:35 AM    This note was created with Dragon medical transcription system.  Any errors from dictation are purely unintentional

## 2022-11-06 ENCOUNTER — Other Ambulatory Visit: Payer: Self-pay

## 2022-11-12 ENCOUNTER — Encounter: Payer: Self-pay | Admitting: *Deleted

## 2022-11-13 ENCOUNTER — Ambulatory Visit: Payer: BC Managed Care – PPO | Admitting: Certified Registered"

## 2022-11-13 ENCOUNTER — Ambulatory Visit
Admission: RE | Admit: 2022-11-13 | Discharge: 2022-11-13 | Disposition: A | Discharge status: Home / Self Care | Payer: BC Managed Care – PPO | Source: Ambulatory Visit | Attending: Gastroenterology | Admitting: Gastroenterology

## 2022-11-13 ENCOUNTER — Other Ambulatory Visit: Payer: Self-pay

## 2022-11-13 ENCOUNTER — Encounter
Admission: RE | Disposition: A | Discharge status: Home / Self Care | Payer: Self-pay | Source: Ambulatory Visit | Attending: Gastroenterology

## 2022-11-13 ENCOUNTER — Encounter: Payer: Self-pay | Admitting: *Deleted

## 2022-11-13 DIAGNOSIS — Z7901 Long term (current) use of anticoagulants: Secondary | ICD-10-CM | POA: Insufficient documentation

## 2022-11-13 DIAGNOSIS — Z83719 Family history of colon polyps, unspecified: Secondary | ICD-10-CM | POA: Diagnosis not present

## 2022-11-13 DIAGNOSIS — R1310 Dysphagia, unspecified: Secondary | ICD-10-CM | POA: Insufficient documentation

## 2022-11-13 DIAGNOSIS — K449 Diaphragmatic hernia without obstruction or gangrene: Secondary | ICD-10-CM | POA: Diagnosis not present

## 2022-11-13 DIAGNOSIS — Z1211 Encounter for screening for malignant neoplasm of colon: Secondary | ICD-10-CM | POA: Insufficient documentation

## 2022-11-13 DIAGNOSIS — Z86718 Personal history of other venous thrombosis and embolism: Secondary | ICD-10-CM | POA: Insufficient documentation

## 2022-11-13 DIAGNOSIS — K640 First degree hemorrhoids: Secondary | ICD-10-CM | POA: Insufficient documentation

## 2022-11-13 DIAGNOSIS — K219 Gastro-esophageal reflux disease without esophagitis: Secondary | ICD-10-CM | POA: Insufficient documentation

## 2022-11-13 DIAGNOSIS — E039 Hypothyroidism, unspecified: Secondary | ICD-10-CM | POA: Insufficient documentation

## 2022-11-13 DIAGNOSIS — K317 Polyp of stomach and duodenum: Secondary | ICD-10-CM | POA: Insufficient documentation

## 2022-11-13 DIAGNOSIS — Q438 Other specified congenital malformations of intestine: Secondary | ICD-10-CM | POA: Diagnosis not present

## 2022-11-13 DIAGNOSIS — F419 Anxiety disorder, unspecified: Secondary | ICD-10-CM | POA: Diagnosis not present

## 2022-11-13 HISTORY — PX: BIOPSY: SHX5522

## 2022-11-13 HISTORY — PX: COLONOSCOPY WITH PROPOFOL: SHX5780

## 2022-11-13 HISTORY — PX: ESOPHAGOGASTRODUODENOSCOPY (EGD) WITH PROPOFOL: SHX5813

## 2022-11-13 SURGERY — COLONOSCOPY WITH PROPOFOL
Anesthesia: General

## 2022-11-13 MED ORDER — PROPOFOL 10 MG/ML IV BOLUS
INTRAVENOUS | Status: DC | PRN
  Administered 2022-11-13: 150 ug/kg/min via INTRAVENOUS

## 2022-11-13 MED ORDER — SODIUM CHLORIDE 0.9 % IV SOLN: INTRAVENOUS | Status: DC

## 2022-11-13 MED ORDER — LIDOCAINE HCL (CARDIAC) PF 100 MG/5ML IV SOSY
PREFILLED_SYRINGE | INTRAVENOUS | Status: DC | PRN
Start: 2022-11-13 — End: 2022-11-13
  Administered 2022-11-13: 200 mg via INTRAVENOUS

## 2022-11-13 NOTE — Interval H&P Note (Signed)
History and Physical Interval Note:  11/13/2022 10:14 AM  Crystal Russo  has presented today for surgery, with the diagnosis of FAMILY HX OF COLON POLYP,PHARYNGOESOPHAGEAL DYSPHAGIA.  The various methods of treatment have been discussed with the patient and family. After consideration of risks, benefits and other options for treatment, the patient has consented to  Procedure(s): COLONOSCOPY WITH PROPOFOL (N/A) ESOPHAGOGASTRODUODENOSCOPY (EGD) WITH PROPOFOL (N/A) as a surgical intervention.  The patient's history has been reviewed, patient examined, no change in status, stable for surgery.  I have reviewed the patient's chart and labs.  Questions were answered to the patient's satisfaction.     Regis Bill  Ok to proceed with EGD/Colonoscopy

## 2022-11-13 NOTE — Op Note (Signed)
[facility] Gastroenterology Patient Name: Crystal Russo Procedure Date: 11/13/2022 10:01 AM MRN: [id number] Account #: [id number] Date of Birth: [birth year] Admit Type: Outpatient Age: 62 Room: [facility] ENDO ROOM 3 Gender: Female Note Status: Finalized Instrument Name: Prentice Docker [id number] Procedure:             Colonoscopy Indications:           Colon cancer screening in patient at increased risk:                         Family history of colon polyps in multiple 1st-degree                         relatives Providers:             Eather Colas MD, MD Medicines:             Monitored Anesthesia Care Complications:         No immediate complications. Estimated blood loss:                         Minimal. Procedure:             Pre-Anesthesia Assessment:                        - Prior to the procedure, a History and Physical was                         performed, and patient medications and allergies were                         reviewed. The patient is competent. The risks and                         benefits of the procedure and the sedation options and                         risks were discussed with the patient. All questions                         were answered and informed consent was obtained.                         Patient identification and proposed procedure were                         verified by the physician, the nurse, the                         anesthesiologist, the anesthetist and the technician                         in the endoscopy suite. Mental Status Examination:                         alert and oriented. Airway Examination: normal                         oropharyngeal airway and neck mobility. Respiratory  Examination: clear to auscultation. CV Examination:                         normal. Prophylactic Antibiotics: The patient does not                         require prophylactic antibiotics. Prior                          Anticoagulants: The patient has taken no anticoagulant                         or antiplatelet agents. ASA Grade Assessment: III - A                         patient with severe systemic disease. After reviewing                         the risks and benefits, the patient was deemed in                         satisfactory condition to undergo the procedure. The                         anesthesia plan was to use monitored anesthesia care                         (MAC). Immediately prior to administration of                         medications, the patient was re-assessed for adequacy                         to receive sedatives. The heart rate, respiratory                         rate, oxygen saturations, blood pressure, adequacy of                         pulmonary ventilation, and response to care were                         monitored throughout the procedure. The physical                         status of the patient was re-assessed after the                         procedure.                        After obtaining informed consent, the colonoscope was                         passed under direct vision. Throughout the procedure,                         the patient's blood pressure, pulse, and oxygen  saturations were monitored continuously. The                         Colonoscope was introduced through the anus and                         advanced to the the terminal ileum. The colonoscopy                         was somewhat difficult due to significant looping.                         Successful completion of the procedure was aided by                         applying abdominal pressure. The patient tolerated the                         procedure well. The quality of the bowel preparation                         was excellent. The terminal ileum, ileocecal valve,                         appendiceal orifice, and rectum were photographed. Findings:      The  perianal and digital rectal examinations were normal.      The terminal ileum appeared normal.      Internal hemorrhoids were found during retroflexion. The hemorrhoids       were Grade I (internal hemorrhoids that do not prolapse).      The exam was otherwise without abnormality on direct and retroflexion       views. Impression:            - The examined portion of the ileum was normal.                        - Internal hemorrhoids.                        - The examination was otherwise normal on direct and                         retroflexion views.                        - No specimens collected. Recommendation:        - Discharge patient to home.                        - Resume previous diet.                        - Continue present medications.                        - Repeat colonoscopy in 5 years for screening purposes.                        - Return to referring physician as previously  scheduled. Procedure Code(s):     --- Professional ---                        H4193, Colorectal cancer screening; colonoscopy on                         individual at high risk Diagnosis Code(s):     --- Professional ---                        Z83.71, Family history of colonic polyps                        K64.0, First degree hemorrhoids CPT copyright 2022 American Medical Association. All rights reserved. The codes documented in this report are preliminary and upon coder review may  be revised to meet current compliance requirements. Eather Colas MD, MD 11/13/2022 10:50:27 AM Number of Addenda: 0 Note Initiated On: 11/13/2022 10:01 AM Scope Withdrawal Time: 0 hours 7 minutes 16 seconds  Total Procedure Duration: 0 hours 14 minutes 35 seconds  Estimated Blood Loss:  Estimated blood loss was minimal.      [facility]

## 2022-11-13 NOTE — Transfer of Care (Signed)
Immediate Anesthesia Transfer of Care Note  Patient: Crystal Russo  Procedure(s) Performed: COLONOSCOPY WITH PROPOFOL ESOPHAGOGASTRODUODENOSCOPY (EGD) WITH PROPOFOL BIOPSY  Patient Location: PACU  Anesthesia Type:General  Level of Consciousness: awake, alert , and oriented  Airway & Oxygen Therapy: Patient Spontanous Breathing and Patient connected to nasal cannula oxygen  Post-op Assessment: Report given to RN and Post -op Vital signs reviewed and stable  Post vital signs: stable  Last Vitals:  Vitals Value Taken Time  BP    Temp    Pulse 85 11/13/22 1046  Resp 17 11/13/22 1046  SpO2 100 % 11/13/22 1046  Vitals shown include unfiled device data.  Last Pain:  Vitals:   11/13/22 1045  TempSrc:   PainSc: 0-No pain         Complications: No notable events documented.

## 2022-11-13 NOTE — Anesthesia Preprocedure Evaluation (Signed)
Anesthesia Evaluation  Patient identified by MRN, date of birth, ID band Patient awake    Reviewed: Allergy & Precautions, NPO status , Patient's Chart, lab work & pertinent test results  History of Anesthesia Complications Negative for: history of anesthetic complications  Airway Mallampati: III  TM Distance: >3 FB Neck ROM: full    Dental no notable dental hx.    Pulmonary neg pulmonary ROS   Pulmonary exam normal        Cardiovascular negative cardio ROS Normal cardiovascular exam     Neuro/Psych  PSYCHIATRIC DISORDERS Anxiety      Neuromuscular disease    GI/Hepatic Neg liver ROS, hiatal hernia,GERD  Medicated,,  Endo/Other  Hypothyroidism    Renal/GU Renal disease  negative genitourinary   Musculoskeletal   Abdominal   Peds  Hematology negative hematology ROS (+) May Thurner syndrome, HX of DVT on Elquis, iliac stent     Anesthesia Other Findings Past Medical History: No date: Cancer [facility])     Comment:  skin cancer No date: Chronic kidney disease     Comment:  improved post discontinuing NSAIDS 10/13/2012: Hx of dysplastic nevus     Comment:  L proximal ant thigh, mild atypia 10/13/2012: Hx of dysplastic nevus     Comment:  L dorsum foot, mild atypia 10/22/2014: Hx of dysplastic nevus     Comment:  R ant lat ankle, mild atypia No date: Hypothyroidism No date: IBS (irritable bowel syndrome) No date: Mitral valve prolapse  Past Surgical History: No date: ABDOMINAL HYSTERECTOMY No date: APPENDECTOMY No date: BREAST SURGERY     Comment:  left nipple biopsy No date: MANDIBLE SURGERY 11/02/2019: PERIPHERAL VASCULAR THROMBECTOMY; Left     Comment:  Procedure: PERIPHERAL VASCULAR THROMBECTOMY /               THROMBOLYSIS;  Surgeon: Annice Needy, MD;  Location: [facility]              INVASIVE CV LAB;  Service: Cardiovascular;  Laterality:               Left; No date: SHOULDER SURGERY No date: TUBAL  LIGATION 10/26/07: UPPER GI ENDOSCOPY     Comment:  reflux esophagitis and a single gastric polyp     Reproductive/Obstetrics negative OB ROS                             Anesthesia Physical Anesthesia Plan  ASA: 3  Anesthesia Plan: General   Post-op Pain Management: Minimal or no pain anticipated   Induction: Intravenous  PONV Risk Score and Plan: 2 and Propofol infusion and TIVA  Airway Management Planned: Natural Airway and Nasal Cannula  Additional Equipment:   Intra-op Plan:   Post-operative Plan:   Informed Consent: I have reviewed the patients History and Physical, chart, labs and discussed the procedure including the risks, benefits and alternatives for the proposed anesthesia with the patient or authorized representative who has indicated his/her understanding and acceptance.     Dental Advisory Given  Plan Discussed with: Anesthesiologist, CRNA and Surgeon  Anesthesia Plan Comments: (Patient consented for risks of anesthesia including but not limited to:  - adverse reactions to medications - risk of airway placement if required - damage to eyes, teeth, lips or other oral mucosa - nerve damage due to positioning  - sore throat or hoarseness - Damage to heart, brain, nerves, lungs, other parts of body or loss of life  Patient voiced understanding.)       Anesthesia Quick Evaluation

## 2022-11-13 NOTE — Anesthesia Postprocedure Evaluation (Signed)
Anesthesia Post Note  Patient: Elon Weith Richoux  Procedure(s) Performed: COLONOSCOPY WITH PROPOFOL ESOPHAGOGASTRODUODENOSCOPY (EGD) WITH PROPOFOL BIOPSY  Patient location during evaluation: Endoscopy Anesthesia Type: General Level of consciousness: awake and alert Pain management: pain level controlled Vital Signs Assessment: post-procedure vital signs reviewed and stable Respiratory status: spontaneous breathing, nonlabored ventilation, respiratory function stable and patient connected to nasal cannula oxygen Cardiovascular status: blood pressure returned to baseline and stable Postop Assessment: no apparent nausea or vomiting Anesthetic complications: no   There were no known notable events for this encounter.   Last Vitals:  Vitals:   11/13/22 1045 11/13/22 1106  BP: 119/71 (!) 143/79  Pulse: 85   Resp: (!) 23   Temp:  (!) 35.6 C  SpO2: 100%     Last Pain:  Vitals:   11/13/22 1106  TempSrc: Temporal  PainSc: 0-No pain                 Louie Boston

## 2022-11-13 NOTE — H&P (Signed)
Outpatient short stay form Pre-procedure 11/13/2022  Regis Bill, MD  Primary Physician: Bosie Clos, MD  Reason for visit:  Dysphagia/Screening  History of present illness:    62 y/o lady with history of hypothyroidism here for EGD for pill dysphagia. Does well with solids as long as she chews her food. Colonoscopy for family of polyps. Last colonoscopy 5 years ago was unremarkable.    Current Facility-Administered Medications:    0.9 %  sodium chloride infusion, , Intravenous, Continuous, Freddy Kinne, Rossie Muskrat, MD, Last Rate: 20 mL/hr at 11/13/22 0955, Continued from Pre-op at 11/13/22 0955  Medications Prior to Admission  Medication Sig Dispense Refill Last Dose   clidinium-chlordiazePOXIDE (LIBRAX) 5-2.5 MG capsule TAKE 1 CAPSULE TWICE A DAY AS NEEDED 180 capsule 1 11/13/2022   DULoxetine (CYMBALTA) 60 MG capsule Take 1 capsule (60 mg total) by mouth daily. 90 capsule 0 11/13/2022   levothyroxine (SYNTHROID) 150 MCG tablet TAKE 1 TABLET DAILY BEFORE BREAKFAST (NEED LABS BEFORE NEXT REFILL IS DUE) 30 tablet 11 11/13/2022   RABEprazole (ACIPHEX) 20 MG tablet Take 1 tablet (20 mg total) by mouth 2 (two) times daily. 180 tablet 1 11/13/2022   apixaban (ELIQUIS) 5 MG TABS tablet Take 1 tablet (5 mg total) by mouth 2 (two) times daily. 180 tablet 3 11/10/2022   Apremilast (OTEZLA) 30 MG TABS Take 1 tablet (30 mg total) by mouth 2 (two) times daily. 60 tablet 11    betamethasone dipropionate (DIPROLENE) 0.05 % ointment Apply topically 2 (two) times daily.       Calcipotriene 0.005 % FOAM Apply topically. Apply as needed on weekly on Saturday      cetirizine (ZYRTEC) 10 MG tablet Take 10 mg by mouth daily.      Cholecalciferol (VITAMIN D) 2000 units CAPS Take by mouth.      Cyanocobalamin 5000 MCG/ML LIQD Place under the tongue. 1/2 dropper po QD      estradiol (VIVELLE-DOT) 0.1 MG/24HR patch Place 1 patch onto the skin 2 (two) times a week.   12    fluticasone (FLONASE) 50 MCG/ACT  nasal spray USE 2 SPRAYS IN THE NOSE DAILY AS DIRECTED (Patient taking differently: as needed.) 16 g 5    folic acid (FOLVITE) 1 MG tablet TAKE 1 TABLET DAILY 90 tablet 3    topiramate (TOPAMAX) 50 MG tablet TAKE 1 TABLET DAILY 90 tablet 0    XDEMVY 0.25 % SOLN Apply to eye.        Allergies  Allergen Reactions   Nsaids    Other      Past Medical History:  Diagnosis Date   Cancer (HCC)    skin cancer   Chronic kidney disease    improved post discontinuing NSAIDS   Hx of dysplastic nevus 10/13/2012   L proximal ant thigh, mild atypia   Hx of dysplastic nevus 10/13/2012   L dorsum foot, mild atypia   Hx of dysplastic nevus 10/22/2014   R ant lat ankle, mild atypia   Hypothyroidism    IBS (irritable bowel syndrome)    Mitral valve prolapse     Review of systems:  Otherwise negative.    Physical Exam  Gen: Alert, oriented. Appears stated age.  HEENT: PERRLA. Lungs: No respiratory distress CV: RRR Abd: soft, benign, no masses Ext: No edema    Planned procedures: Proceed with EGD/colonoscopy. The patient understands the nature of the planned procedure, indications, risks, alternatives and potential complications including but not limited to bleeding, infection, perforation,  damage to internal organs and possible oversedation/side effects from anesthesia. The patient agrees and gives consent to proceed.  Please refer to procedure notes for findings, recommendations and patient disposition/instructions.     Regis Bill, MD [facility]

## 2022-11-13 NOTE — Op Note (Signed)
[facility] Patient Name: Crystal Russo Procedure Date: 11/13/2022 10:08 AM MRN: [id number] Account #: [id number] Date of Birth: [birth year] Admit Type: Outpatient Age: 62 [facility]: [facility] ENDO ROOM 3 Gender: Female Note Status: Finalized Instrument Name: Upper Endoscope [id number] Procedure:             Upper GI endoscopy Indications:           Dysphagia Providers:             Eather Colas MD, MD Medicines:             Monitored Anesthesia Care Complications:         No immediate complications. Estimated blood loss:                         Minimal. Procedure:             Pre-Anesthesia Assessment:                        - Prior to the procedure, a History and Physical was                         performed, and patient medications and allergies were                         reviewed. The patient is competent. The risks and                         benefits of the procedure and the sedation options and                         risks were discussed with the patient. All questions                         were answered and informed consent was obtained.                         Patient identification and proposed procedure were                         verified by the physician, the nurse, the                         anesthesiologist, the anesthetist and the technician                         in the endoscopy suite. Mental Status Examination:                         alert and oriented. Airway Examination: normal                         oropharyngeal airway and neck mobility. Respiratory                         Examination: clear to auscultation. CV Examination:                         normal. Prophylactic Antibiotics: The patient does not  require prophylactic antibiotics. Prior                         Anticoagulants: The patient has taken no anticoagulant                         or antiplatelet agents. ASA Grade Assessment: III - A                          patient with severe systemic disease. After reviewing                         the risks and benefits, the patient was deemed in                         satisfactory condition to undergo the procedure. The                         anesthesia plan was to use monitored anesthesia care                         (MAC). Immediately prior to administration of                         medications, the patient was re-assessed for adequacy                         to receive sedatives. The heart rate, respiratory                         rate, oxygen saturations, blood pressure, adequacy of                         pulmonary ventilation, and response to care were                         monitored throughout the procedure. The physical                         status of the patient was re-assessed after the                         procedure.                        After obtaining informed consent, the endoscope was                         passed under direct vision. Throughout the procedure,                         the patient's blood pressure, pulse, and oxygen                         saturations were monitored continuously. The Endoscope                         was introduced through the mouth, and advanced to the  second part of duodenum. The upper GI endoscopy was                         accomplished without difficulty. The patient tolerated                         the procedure well. Findings:      No endoscopic abnormality was evident in the esophagus to explain the       patient's complaint of dysphagia. Biopsies were obtained from the       proximal and distal esophagus with cold forceps for histology of       suspected eosinophilic esophagitis. Estimated blood loss was minimal.       The upper esophageal sphincter was somewhat narrow.      A small hiatal hernia was present.      Multiple small sessile fundic gland polyps with no bleeding and no       stigmata  of recent bleeding were found in the gastric fundus and in the       gastric body.      The exam of the stomach was otherwise normal.      The examined duodenum was normal. Impression:            - No endoscopic esophageal abnormality to explain                         patient's dysphagia. Upper esophageal sphincter was                         somewhat narrow.                        - Small hiatal hernia.                        - Multiple fundic gland polyps.                        - Normal examined duodenum.                        - Biopsies were taken with a cold forceps for                         evaluation of eosinophilic esophagitis. Recommendation:        - Discharge patient to home.                        - Resume previous diet.                        - Continue present medications.                        - Await pathology results.                        - Return to referring physician as previously                         scheduled. Procedure Code(s):     --- Professional ---  09811, Esophagogastroduodenoscopy, flexible,                         transoral; with biopsy, single or multiple Diagnosis Code(s):     --- Professional ---                        R13.10, Dysphagia, unspecified                        K44.9, Diaphragmatic hernia without obstruction or                         gangrene                        K31.7, Polyp of stomach and duodenum CPT copyright 2022 [employer]. All rights reserved. The codes documented in this report are preliminary and upon coder review may  be revised to meet current compliance requirements. Eather Colas MD, MD 11/13/2022 10:48:13 AM Number of Addenda: 0 Note Initiated On: 11/13/2022 10:08 AM Estimated Blood Loss:  Estimated blood loss was minimal.      [facility]

## 2022-11-16 ENCOUNTER — Encounter: Payer: Self-pay | Admitting: Gastroenterology

## 2022-11-19 LAB — SURGICAL PATHOLOGY

## 2022-12-08 ENCOUNTER — Telehealth: Payer: Self-pay

## 2022-12-08 NOTE — Telephone Encounter (Signed)
error 

## 2023-01-03 ENCOUNTER — Other Ambulatory Visit: Payer: Self-pay

## 2023-01-03 ENCOUNTER — Emergency Department
Admission: EM | Admit: 2023-01-03 | Discharge: 2023-01-03 | Disposition: A | Discharge status: Home / Self Care | Payer: BC Managed Care – PPO | Attending: Emergency Medicine | Admitting: Emergency Medicine

## 2023-01-03 ENCOUNTER — Emergency Department: Payer: BC Managed Care – PPO

## 2023-01-03 ENCOUNTER — Encounter: Payer: Self-pay | Admitting: Emergency Medicine

## 2023-01-03 DIAGNOSIS — W19XXXA Unspecified fall, initial encounter: Secondary | ICD-10-CM

## 2023-01-03 DIAGNOSIS — W010XXA Fall on same level from slipping, tripping and stumbling without subsequent striking against object, initial encounter: Secondary | ICD-10-CM | POA: Diagnosis not present

## 2023-01-03 DIAGNOSIS — S0990XA Unspecified injury of head, initial encounter: Secondary | ICD-10-CM | POA: Diagnosis present

## 2023-01-03 DIAGNOSIS — M25512 Pain in left shoulder: Secondary | ICD-10-CM

## 2023-01-03 DIAGNOSIS — S40212A Abrasion of left shoulder, initial encounter: Secondary | ICD-10-CM | POA: Diagnosis not present

## 2023-01-03 DIAGNOSIS — S0083XA Contusion of other part of head, initial encounter: Secondary | ICD-10-CM | POA: Diagnosis not present

## 2023-01-03 DIAGNOSIS — Z7901 Long term (current) use of anticoagulants: Secondary | ICD-10-CM | POA: Diagnosis not present

## 2023-01-03 MED ORDER — OXYCODONE HCL 5 MG PO TABS: 5.0000 mg | ORAL_TABLET | Freq: Three times a day (TID) | ORAL | 0 refills | Status: DC | PRN

## 2023-01-03 NOTE — ED Notes (Signed)
Pt verbalizes understanding of discharge instructions. Opportunity for questioning and answers were provided. Pt discharged from ED to home with husband.    

## 2023-01-03 NOTE — ED Triage Notes (Signed)
Pt comes with c/o fall. Pt states she tripped and fell. Pt is on thinners. Pt states she hurt her shoulder and left side face. Pt denies any loc.

## 2023-01-03 NOTE — ED Provider Notes (Signed)
[facility] Provider Note    Event Date/Time   First MD Initiated Contact with Patient 01/03/23 1533     (approximate)   History   Fall   HPI Crystal Russo is a 62 y.o. female resenting today for fall.  Patient states she tripped earlier today while setting up stuff for her granddaughters birthday party.  Larey Seat and landed on her left shoulder as well as the front of her face.  No loss of consciousness.  Is on Eliquis.  No trauma elsewhere besides the face, side of her neck, and left shoulder.  Has been able to ambulate since then     Physical Exam   Triage Vital Signs: ED Triage Vitals  Encounter Vitals Group     BP 01/03/23 1405 133/69     Systolic BP Percentile --      Diastolic BP Percentile --      Pulse Rate 01/03/23 1405 64     Resp 01/03/23 1405 18     Temp 01/03/23 1405 (!) 97.4 F (36.3 C)     Temp src --      SpO2 01/03/23 1405 100 %     Weight --      Height --      Head Circumference --      Peak Flow --      Pain Score 01/03/23 1404 10     Pain Loc --      Pain Education --      Exclude from Growth Chart --     Most recent vital signs: Vitals:   01/03/23 1405  BP: 133/69  Pulse: 64  Resp: 18  Temp: (!) 97.4 F (36.3 C)  SpO2: 100%   I have reviewed the vital signs. General:  Awake, alert, no acute distress. Head:  Normocephalic, bruising noted along the left temple, and left forehead. EENT:  PERRL, EOMI, Oral mucosa pink and moist, Neck is supple.  No C-spine tenderness palpation Cardiovascular: Regular rate, 2+ distal pulses. Respiratory:  Normal respiratory effort, symmetrical expansion, no distress.   Extremities: Tenderness palpation to left shoulder.  Mild pain with range of motion.  No tenderness palpation noted throughout chest wall, or other 3 extremities. Neuro:  Alert and oriented.  Interacting appropriately.   Skin:  Warm, dry, no rash.  Abrasion noted to superior left shoulder. Psych: Appropriate affect.     ED Results / Procedures / Treatments   Labs (all labs ordered are listed, but only abnormal results are displayed) Labs Reviewed - No data to display   EKG    RADIOLOGY Independently interpreted CT and x-ray imaging with no acute pathology   PROCEDURES:  Critical Care performed: No  Procedures   MEDICATIONS ORDERED IN ED: Medications - No data to display   IMPRESSION / MDM / ASSESSMENT AND PLAN / ED COURSE  I reviewed the triage vital signs and the nursing notes.                              Differential diagnosis includes, but is not limited to, ICH, cervical spine injury, humerus fracture, clavicle fracture  Patient's presentation is most consistent with acute complicated illness / injury requiring diagnostic workup.  Patient is a 62 year old female presenting today for ground-level fall with injury to her left shoulder, left side of her face, and left neck.  CT imaging of head, C-spine, and max/face showed no acute traumatic pathology.  X-ray of left shoulder shows no fracture.  Patient otherwise stable and safe for discharge at this time.  Pain medication sent to her pharmacy.  Given strict return precautions and told to follow-up with PCP.      FINAL CLINICAL IMPRESSION(S) / ED DIAGNOSES   Final diagnoses:  Fall, initial encounter  Injury of head, initial encounter  Acute pain of left shoulder     Rx / DC Orders   ED Discharge Orders          Ordered    oxyCODONE (ROXICODONE) 5 MG immediate release tablet  Every 8 hours PRN        01/03/23 1551             Note:  This document was prepared using Dragon voice recognition software and may include unintentional dictation errors.   Janith Lima, MD 01/03/23 [phone]

## 2023-03-03 ENCOUNTER — Ambulatory Visit: Payer: BC Managed Care – PPO | Admitting: Dermatology

## 2023-03-08 ENCOUNTER — Encounter: Payer: Self-pay | Admitting: Dermatology

## 2023-03-08 ENCOUNTER — Ambulatory Visit: Payer: BC Managed Care – PPO | Admitting: Dermatology

## 2023-03-08 DIAGNOSIS — L578 Other skin changes due to chronic exposure to nonionizing radiation: Secondary | ICD-10-CM | POA: Diagnosis not present

## 2023-03-08 DIAGNOSIS — D492 Neoplasm of unspecified behavior of bone, soft tissue, and skin: Secondary | ICD-10-CM | POA: Diagnosis not present

## 2023-03-08 DIAGNOSIS — W908XXA Exposure to other nonionizing radiation, initial encounter: Secondary | ICD-10-CM

## 2023-03-08 DIAGNOSIS — D2271 Melanocytic nevi of right lower limb, including hip: Secondary | ICD-10-CM

## 2023-03-08 DIAGNOSIS — Z808 Family history of malignant neoplasm of other organs or systems: Secondary | ICD-10-CM

## 2023-03-08 DIAGNOSIS — Z1283 Encounter for screening for malignant neoplasm of skin: Secondary | ICD-10-CM | POA: Diagnosis not present

## 2023-03-08 DIAGNOSIS — Z86018 Personal history of other benign neoplasm: Secondary | ICD-10-CM

## 2023-03-08 DIAGNOSIS — L814 Other melanin hyperpigmentation: Secondary | ICD-10-CM

## 2023-03-08 DIAGNOSIS — L409 Psoriasis, unspecified: Secondary | ICD-10-CM

## 2023-03-08 DIAGNOSIS — D229 Melanocytic nevi, unspecified: Secondary | ICD-10-CM

## 2023-03-08 DIAGNOSIS — D1801 Hemangioma of skin and subcutaneous tissue: Secondary | ICD-10-CM

## 2023-03-08 DIAGNOSIS — L821 Other seborrheic keratosis: Secondary | ICD-10-CM

## 2023-03-08 NOTE — Progress Notes (Signed)
 Follow-Up Visit   Subjective  Crystal Russo is a 63 y.o. female who presents for the following: Skin Cancer Screening and Full Body Skin Exam. Hx of dysplastic nevi. Family Hx of MM.   Has 2 new spots between breasts. Raised. Non tender. Itch at times. New spot on R-3 plantar toe. Noticed within the past week. Wears compression stockings and would have noticed if lesion had been there prior to 1 week ago.   Hx of psoriasis on left plantar foot. Would like samples of Vtama and Zoryve as it is starting to flare.   The patient presents for Total-Body Skin Exam (TBSE) for skin cancer screening and mole check. The patient has spots, moles and lesions to be evaluated, some may be new or changing and the patient may have concern these could be cancer.    The following portions of the chart were reviewed this encounter and updated as appropriate: medications, allergies, medical history  Review of Systems:  No other skin or systemic complaints except as noted in HPI or Assessment and Plan.  Objective  Well appearing patient in no apparent distress; mood and affect are within normal limits.  A full examination was performed including scalp, head, eyes, ears, nose, lips, neck, chest, axillae, abdomen, back, buttocks, bilateral upper extremities, bilateral lower extremities, hands, feet, fingers, toes, fingernails, and toenails. All findings within normal limits unless otherwise noted below.   Relevant physical exam findings are noted in the Assessment and Plan.  Right Tip of 3rd  Plantar Toe 6 mm Pigmented papule   Assessment & Plan   HISTORY OF DYSPLASTIC NEVI No evidence of recurrence today Recommend regular full body skin exams Recommend daily broad spectrum sunscreen SPF 30+ to sun-exposed areas, reapply every 2 hours as needed.  Call if any new or changing lesions are noted between office visits   FAMILY HISTORY OF SKIN CANCER What type(s): Melanoma Who affected: Mother, niece,  brother   SKIN CANCER SCREENING PERFORMED TODAY.  ACTINIC DAMAGE - Chronic condition, secondary to cumulative UV/sun exposure - diffuse scaly erythematous macules with underlying dyspigmentation - Recommend daily broad spectrum sunscreen SPF 30+ to sun-exposed areas, reapply every 2 hours as needed.  - Staying in the shade or wearing long sleeves, sun glasses (UVA+UVB protection) and wide brim hats (4-inch brim around the entire circumference of the hat) are also recommended for sun protection.  - Call for new or changing lesions.  LENTIGINES, SEBORRHEIC KERATOSES, HEMANGIOMAS - Benign normal skin lesions - Benign-appearing - Call for any changes  MELANOCYTIC NEVI - Tan-brown and/or pink-flesh-colored symmetric macules and papules - Benign appearing on exam today - Observation - Call clinic for new or changing moles - Recommend daily use of broad spectrum spf 30+ sunscreen to sun-exposed areas.   SEBORRHEIC KERATOSIS - Stuck-on, waxy, tan-brown papules at intermammary  - Benign-appearing - Discussed benign etiology and prognosis. - Observe - Call for any changes  PSORIASIS, previous severe flare that responded to Otezla , Zoryve and Vtama Exam: xerosis, scale at left plantar foot. 1% BSA.  Chronic and persistent condition with duration or expected duration over one year. Condition is symptomatic/ bothersome to patient. Not currently at goal.   patient denies joint pain  Psoriasis is a chronic non-curable, but treatable genetic/hereditary disease that may have other systemic features affecting other organ systems such as joints (Psoriatic Arthritis). It is associated with an increased risk of inflammatory bowel disease, heart disease, non-alcoholic fatty liver disease, and depression.  Treatments include light  and laser treatments; topical medications; and systemic medications including oral and injectables.  Treatment Plan:  Vtama cream x3 samples given, apply to lesions on  feet daily Lot: [id number] Exp: 04/2023  Zoryve 0.3% cream x3 samples given, apply to lesions on feet daily Lot: [id number] Exp: 01/2024   NEOPLASM OF SKIN Right Tip of 3rd  Plantar Toe Skin / nail biopsy Type of biopsy: tangential   Informed consent: discussed and consent obtained   Timeout: patient name, date of birth, surgical site, and procedure verified   Procedure prep:  Patient was prepped and draped in usual sterile fashion Prep type:  Isopropyl alcohol Anesthesia: the lesion was anesthetized in a standard fashion   Anesthetic:  1% lidocaine  plain local infiltration Instrument used: DermaBlade   Hemostasis achieved with: pressure and aluminum chloride   Outcome: patient tolerated procedure well   Post-procedure details: sterile dressing applied and wound care instructions given   Dressing type: bandage and petrolatum   Specimen 1 - Surgical pathology Differential Diagnosis: r/o melanoma   Check Margins: No MULTIPLE BENIGN NEVI   LENTIGINES   ACTINIC ELASTOSIS   SEBORRHEIC KERATOSES   CHERRY ANGIOMA   PSORIASIS   Related Medications Apremilast  (OTEZLA ) 30 MG TABS Take 1 tablet (30 mg total) by mouth 2 (two) times daily. Return in about 1 year (around 03/07/2024) for TBSE, HxDN.  I, Jill Parcell, CMA, am acting as scribe for Boneta Sharps, MD.   Documentation: I have reviewed the above documentation for accuracy and completeness, and I agree with the above.  Boneta Sharps, MD

## 2023-03-08 NOTE — Patient Instructions (Addendum)
 Wound Care Instructions  Cleanse wound gently with soap and water once a day then pat dry with clean gauze. Apply a thin coat of Petrolatum (petroleum jelly, "Vaseline") over the wound (unless you have an allergy to this). We recommend that you use a new, sterile tube of Vaseline. Do not pick or remove scabs. Do not remove the yellow or white "healing tissue" from the base of the wound.  Cover the wound with fresh, clean, nonstick gauze and secure with paper tape. You may use Band-Aids in place of gauze and tape if the wound is small enough, but would recommend trimming much of the tape off as there is often too much. Sometimes Band-Aids can irritate the skin.  You should call the office for your biopsy report after 1 week if you have not already been contacted.  If you experience any problems, such as abnormal amounts of bleeding, swelling, significant bruising, significant pain, or evidence of infection, please call the office immediately.  FOR ADULT SURGERY PATIENTS: If you need something for pain relief you may take 1 extra strength Tylenol (acetaminophen) AND 2 Ibuprofen (200mg  each) together every 4 hours as needed for pain. (do not take these if you are allergic to them or if you have a reason you should not take them.) Typically, you may only need pain medication for 1 to 3 days.       Recommend daily broad spectrum sunscreen SPF 30+ to sun-exposed areas, reapply every 2 hours as needed. Call for new or changing lesions.  Staying in the shade or wearing long sleeves, sun glasses (UVA+UVB protection) and wide brim hats (4-inch brim around the entire circumference of the hat) are also recommended for sun protection.    Melanoma ABCDEs  Melanoma is the most dangerous type of skin cancer, and is the leading cause of death from skin disease.  You are more likely to develop melanoma if you: Have light-colored skin, light-colored eyes, or red or blond hair Spend a lot of time in the sun Tan  regularly, either outdoors or in a tanning bed Have had blistering sunburns, especially during childhood Have a close family member who has had a melanoma Have atypical moles or large birthmarks  Early detection of melanoma is key since treatment is typically straightforward and cure rates are extremely high if we catch it early.   The first sign of melanoma is often a change in a mole or a new dark spot.  The ABCDE system is a way of remembering the signs of melanoma.  A for asymmetry:  The two halves do not match. B for border:  The edges of the growth are irregular. C for color:  A mixture of colors are present instead of an even brown color. D for diameter:  Melanomas are usually (but not always) greater than 6mm - the size of a pencil eraser. E for evolution:  The spot keeps changing in size, shape, and color.  Please check your skin once per month between visits. You can use a small mirror in front and a large mirror behind you to keep an eye on the back side or your body.   If you see any new or changing lesions before your next follow-up, please call to schedule a visit.  Please continue daily skin protection including broad spectrum sunscreen SPF 30+ to sun-exposed areas, reapplying every 2 hours as needed when you're outdoors.   Staying in the shade or wearing long sleeves, sun glasses (UVA+UVB protection) and  wide brim hats (4-inch brim around the entire circumference of the hat) are also recommended for sun protection.     Due to recent changes in healthcare laws, you may see results of your pathology and/or laboratory studies on MyChart before the doctors have had a chance to review them. We understand that in some cases there may be results that are confusing or concerning to you. Please understand that not all results are received at the same time and often the doctors may need to interpret multiple results in order to provide you with the best plan of care or course of  treatment. Therefore, we ask that you please give [place] 2 business days to thoroughly review all your results before contacting the office for clarification. Should we see a critical lab result, you will be contacted sooner.   If You Need Anything After Your Visit  If you have any questions or concerns for your doctor, please call our main line at [phone] and press option 4 to reach your doctor's medical assistant. If no one answers, please leave a voicemail as directed and we will return your call as soon as possible. Messages left after 4 pm will be answered the following business day.   You may also send [place] a message via MyChart. We typically respond to MyChart messages within 1-2 business days.  For prescription refills, please ask your pharmacy to contact our office. Our fax number is [phone].  If you have an urgent issue when the clinic is closed that cannot wait until the next business day, you can page your doctor at the number below.    Please note that while we do our best to be available for urgent issues outside of office hours, we are not available 24/7.   If you have an urgent issue and are unable to reach [place], you may choose to seek medical care at your doctor's office, retail clinic, urgent care center, or emergency room.  If you have a medical emergency, please immediately call 911 or go to the emergency department.  Pager Numbers  - Dr. Gwen Pounds: [phone]  - Dr. Roseanne Reno: [phone]  - Dr. Katrinka Blazing: [phone]   In the event of inclement weather, please call our main line at [phone] for an update on the status of any delays or closures.  Dermatology Medication Tips: Please keep the boxes that topical medications come in in order to help keep track of the instructions about where and how to use these. Pharmacies typically print the medication instructions only on the boxes and not directly on the medication tubes.   If your medication is too expensive,  please contact our office at [phone] option 4 or send [place] a message through MyChart.   We are unable to tell what your co-pay for medications will be in advance as this is different depending on your insurance coverage. However, we may be able to find a substitute medication at lower cost or fill out paperwork to get insurance to cover a needed medication.   If a prior authorization is required to get your medication covered by your insurance company, please allow [place] 1-2 business days to complete this process.  Drug prices often vary depending on where the prescription is filled and some pharmacies may offer cheaper prices.  The website www.goodrx.com contains coupons for medications through different pharmacies. The prices here do not account for what the cost may be with help from insurance (it may be cheaper with your insurance), but the website  can give you the price if you did not use any insurance.  - You can print the associated coupon and take it with your prescription to the pharmacy.  - You may also stop by our office during regular business hours and pick up a GoodRx coupon card.  - If you need your prescription sent electronically to a different pharmacy, notify our office through [facility] or by phone at [phone] option 4.     Si Usted Necesita Algo Despus de Su Visita  Tambin puede enviarnos un mensaje a travs de Clinical cytogeneticist. Por lo general respondemos a los mensajes de MyChart en el transcurso de 1 a 2 das hbiles.  Para renovar recetas, por favor pida a su farmacia que se ponga en contacto con nuestra oficina. Annie Sable de fax es [place] [phone].  Si tiene un asunto urgente cuando la clnica est cerrada y que no puede esperar hasta el siguiente da hbil, puede llamar/localizar a su doctor(a) al nmero que aparece a continuacin.   Por favor, tenga en cuenta que aunque hacemos todo lo posible para estar disponibles para asuntos urgentes fuera del  horario de [employer], no estamos disponibles las 24 horas del da, los 7 [address]  [address] de la [place].   Si tiene un problema urgente y no puede comunicarse con nosotros, puede optar por buscar atencin mdica  en el consultorio de su doctor(a), en una clnica privada, en un centro de atencin urgente o en una sala de emergencias.  Si tiene Engineer, drilling, por favor llame inmediatamente al 911 o vaya a la sala de emergencias.  Nmeros de bper  - Dr. Gwen Pounds: [phone]  - Dra. Roseanne Reno: [phone]  - Dr. Katrinka Blazing: [phone]   En caso de inclemencias del tiempo, por favor llame a Lacy Duverney principal al [phone] para una actualizacin sobre el [place] de cualquier retraso o cierre.  Consejos para la medicacin en dermatologa: Por favor, guarde las cajas en las que vienen los medicamentos de uso tpico para ayudarle a seguir las instrucciones sobre dnde y cmo usarlos. Las farmacias generalmente imprimen las instrucciones del medicamento slo en las cajas y no directamente en los tubos del [place].   Si su medicamento es muy caro, por favor, pngase en contacto con Rolm Gala llamando al [phone] y presione la opcin 4 o envenos un mensaje a travs de Clinical cytogeneticist.   No podemos decirle cul ser su copago por los medicamentos por adelantado ya que esto es diferente dependiendo de la cobertura de su seguro. Sin embargo, es posible que podamos encontrar un medicamento sustituto a Audiological scientist un formulario para que el seguro cubra el medicamento que se considera necesario.   Si se requiere una autorizacin previa para que su compaa de seguros [place] su medicamento, por favor permtanos de 1 a 2 das hbiles para completar [address]  Los precios de los medicamentos varan con frecuencia dependiendo del Environmental consultant de dnde se surte la receta y alguna farmacias pueden ofrecer precios ms baratos.  El sitio web www.goodrx.com tiene cupones para medicamentos de Engineer, civil (consulting). Los precios aqu no tienen en cuenta lo que podra costar con la ayuda del seguro (puede ser ms barato con su seguro), pero el sitio web puede darle el precio si no utiliz Tourist information centre manager.  - Puede imprimir el cupn correspondiente y llevarlo con su receta a la farmacia.  - Tambin puede pasar por nuestra oficina durante el horario de atencin regular y Education officer, museum una tarjeta de cupones de GoodRx.  -  Si necesita que su receta se enve electrnicamente a Psychiatrist, informe a nuestra oficina a travs de MyChart de [place] o por telfono llamando al [phone] y presione la opcin 4.

## 2023-03-11 LAB — SURGICAL PATHOLOGY

## 2023-04-24 ENCOUNTER — Other Ambulatory Visit (INDEPENDENT_AMBULATORY_CARE_PROVIDER_SITE_OTHER): Payer: Self-pay | Admitting: Vascular Surgery

## 2023-06-14 ENCOUNTER — Encounter (INDEPENDENT_AMBULATORY_CARE_PROVIDER_SITE_OTHER): Payer: Self-pay | Admitting: Nurse Practitioner

## 2023-06-14 ENCOUNTER — Ambulatory Visit (INDEPENDENT_AMBULATORY_CARE_PROVIDER_SITE_OTHER): Payer: BC Managed Care – PPO | Admitting: Nurse Practitioner

## 2023-06-14 VITALS — BP 113/78 | HR 77 | Resp 18 | Ht 68.0 in | Wt 190.6 lb

## 2023-06-14 DIAGNOSIS — I871 Compression of vein: Secondary | ICD-10-CM | POA: Diagnosis not present

## 2023-06-14 NOTE — Progress Notes (Signed)
 Subjective:    Patient ID: Crystal Russo, female    DOB: 01/30/1961, 63 y.o.   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  She is doing well.  She is very diligent with use of her medical grade compression stockings.  She denies any issues with taking Eliquis  or any significant bleeding episodes.  No specific complaints today.  Intervention was about 3-1/2 years ago on 11/02/2019      Review of Systems  Cardiovascular:  Negative for leg swelling.  Skin:  Negative for wound.       Objective:   Physical Exam Vitals reviewed.  HENT:     Head: Normocephalic.  Cardiovascular:     Rate and Rhythm: Normal rate.  Pulmonary:     Effort: Pulmonary effort is normal.  Musculoskeletal:     Right lower leg: No edema.     Left lower leg: No edema.  Skin:    General: Skin is warm and dry.  Neurological:     Mental Status: She is alert and oriented to person, place, and time.  Psychiatric:        Mood and Affect: Mood normal.        Behavior: Behavior normal.        Thought Content: Thought content normal.        Judgment: Judgment normal.     BP 113/78   Pulse 77   Resp 18   Ht 5\' 8"  (1.727 m)   Wt 190 lb 9.6 oz (86.5 kg)   BMI 28.98 kg/m   Past Medical History:  Diagnosis Date   Cancer (HCC)    skin cancer   Chronic kidney disease    improved post discontinuing NSAIDS   Hx of dysplastic nevus 10/13/2012   L proximal ant thigh, mild atypia   Hx of dysplastic nevus 10/13/2012   L dorsum foot, mild atypia   Hx of dysplastic nevus 10/22/2014   R ant lat ankle, mild atypia   Hypothyroidism    IBS (irritable bowel syndrome)    Mitral valve prolapse     Social History   Socioeconomic History   Marital status: Married    Spouse name: Not on file   Number of children: Not on file   Years of education: Not on file   Highest education  level: Not on file  Occupational History   Not on file  Tobacco Use   Smoking status: Never   Smokeless tobacco: Never  Vaping Use   Vaping status: Never Used  Substance and Sexual Activity   Alcohol use: Yes    Comment: once a month   Drug use: No   Sexual activity: Yes    Birth control/protection: None, Surgical  Other Topics Concern   Not on file  Social History Narrative   Not on file   Social Drivers of Health   Financial Resource Strain: Low Risk  (03/26/2023)   Received from [facility]   Overall Financial Resource Strain (CARDIA)    Difficulty of Paying Living Expenses: Not hard at all  Food Insecurity: No Food Insecurity (03/26/2023)   [facility]   Hunger Vital Sign    Worried About Running Out of Food in the Last Year: Never true    Ran Out of Food in the Last  Year: Never true  Transportation Needs: No Transportation Needs (03/26/2023)   Received from [facility] - Transportation    In the past 12 months, has lack of transportation kept you from medical appointments or from getting medications?: No    Lack of Transportation (Non-Medical): No  Physical Activity: Inactive (03/26/2023)   Received from [facility]   Exercise Vital Sign    Days of Exercise per Week: 0 days    Minutes of Exercise per Session: 0 min  Stress: Not on file  Social Connections: Not on file  Intimate Partner Violence: Not on file    Past Surgical History:  Procedure Laterality Date   ABDOMINAL HYSTERECTOMY     APPENDECTOMY     BIOPSY  11/13/2022   Procedure: BIOPSY;  Surgeon: Shane Darling, MD;  Location: [facility] ENDOSCOPY;  Service: Endoscopy;;   BREAST SURGERY     left nipple biopsy   COLONOSCOPY WITH PROPOFOL  N/A 11/13/2022   Procedure: COLONOSCOPY WITH PROPOFOL ;  Surgeon: Shane Darling, MD;  Location: [facility] ENDOSCOPY;  Service: Endoscopy;  Laterality: N/A;    ESOPHAGOGASTRODUODENOSCOPY (EGD) WITH PROPOFOL  N/A 11/13/2022   Procedure: ESOPHAGOGASTRODUODENOSCOPY (EGD) WITH PROPOFOL ;  Surgeon: Shane Darling, MD;  Location: [facility] ENDOSCOPY;  Service: Endoscopy;  Laterality: N/A;   MANDIBLE SURGERY     PERIPHERAL VASCULAR THROMBECTOMY Left 11/02/2019   Procedure: PERIPHERAL VASCULAR THROMBECTOMY / THROMBOLYSIS;  Surgeon: Celso College, MD;  Location: [facility] INVASIVE CV LAB;  Service: Cardiovascular;  Laterality: Left;   SHOULDER SURGERY     TUBAL LIGATION     UPPER GI ENDOSCOPY  10/26/07   reflux esophagitis and a single gastric polyp    Family History  Problem Relation Age of Onset   Hypertension Mother    Heart disease Mother    Arthritis Mother    Murrell Arrant' disease Mother    Thyroid  cancer Mother    Fibroids Mother    Diverticulosis Mother    Basal cell carcinoma Mother    Melanoma Mother 60       3 melanomas in her lifetime   Ulcers Father    Hyperthyroidism Father    Arthritis Father    Congestive Heart Failure Father    Diverticulosis Father    Lung cancer Father    Fibroids Sister    Hypertension Sister    Fibromyalgia Sister    Heart disease Sister    Lupus Sister    Melanoma Brother 45   Hypertension Brother    Hyperlipidemia Brother    Skin cancer Brother    Congenital heart disease Brother    Stroke Brother    Basal cell carcinoma Brother    Hypertension Brother    Breast cancer Maternal Aunt 55   Breast cancer Maternal Aunt 44   Cancer Maternal Uncle        unknown dx   Stroke Maternal Grandmother    Stroke Maternal Grandfather    Dementia Paternal Grandmother    Liver cancer Paternal Grandfather    Melanoma Niece 63       brother's daughter    Allergies  Allergen Reactions   Nsaids    Other        Latest Ref Rng & Units 12/26/2020   10:11 AM 03/06/2020   10:26 AM 12/07/2018   10:08 AM  CBC  WBC 3.4 - 10.8 x10E3/uL 5.9  5.8  4.2   Hemoglobin 11.1 - 15.9 g/dL 11.9  14.7  12.9  Hematocrit 34.0 - 46.6 %  36.0  36.7  38.5   Platelets 150 - 450 x10E3/uL 406  432  345       CMP     Component Value Date/Time   NA 140 12/26/2020 1011   K 4.3 12/26/2020 1011   CL 105 12/26/2020 1011   CO2 22 12/26/2020 1011   GLUCOSE 94 12/26/2020 1011   BUN 14 12/26/2020 1011   CREATININE 1.03 (H) 12/26/2020 1011   CALCIUM 10.0 12/26/2020 1011   PROT 7.0 12/26/2020 1011   ALBUMIN 4.4 12/26/2020 1011   AST 14 12/26/2020 1011   ALT 13 12/26/2020 1011   ALKPHOS 106 12/26/2020 1011   BILITOT <0.2 12/26/2020 1011   EGFR 63 12/26/2020 1011   GFRNONAA 64 03/06/2020 1026     No results found.     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  She will continue with Eliquis  and plan lifelong anticoagulation due to high risk after her venous intervention.  She is tolerating her anticoagulation well.  She will continue with use of medical grade compression stockings as well as elevation and activity.  She will follow-up in 1 year.   Current Outpatient Medications on File Prior to Visit  Medication Sig Dispense Refill   cetirizine (ZYRTEC) 10 MG tablet Take 10 mg by mouth daily.     Cholecalciferol (VITAMIN D) 2000 units CAPS Take by mouth.     clidinium-chlordiazePOXIDE  (LIBRAX) 5-2.5 MG capsule TAKE 1 CAPSULE TWICE A DAY AS NEEDED 180 capsule 1   Cyanocobalamin 5000 MCG/ML LIQD Place under the tongue. 1/2 dropper po QD     DULoxetine  (CYMBALTA ) 60 MG capsule Take 1 capsule (60 mg total) by mouth daily. 90 capsule 0   ELIQUIS  5 MG TABS tablet TAKE ONE TABLET BY MOUTH TWICE A DAY 180 tablet 3   fluticasone (FLONASE) 50 MCG/ACT nasal spray USE 2 SPRAYS IN THE NOSE DAILY AS DIRECTED (Patient taking differently: as needed.) 16 g 5   folic acid  (FOLVITE ) 1 MG tablet TAKE 1 TABLET DAILY 90 tablet 3   levothyroxine  (SYNTHROID ) 150 MCG tablet TAKE 1 TABLET DAILY BEFORE BREAKFAST (NEED LABS BEFORE NEXT REFILL IS DUE) 30 tablet  11   RABEprazole  (ACIPHEX ) 20 MG tablet Take 1 tablet (20 mg total) by mouth 2 (two) times daily. 180 tablet 1   topiramate  (TOPAMAX ) 50 MG tablet TAKE 1 TABLET DAILY 90 tablet 0   XDEMVY 0.25 % SOLN Apply to eye.     No current facility-administered medications on file prior to visit.    There are no Patient Instructions on file for this visit. No follow-ups on file.   Kasim Mccorkle E Yarisa Lynam, NP

## 2023-06-14 NOTE — Progress Notes (Incomplete)
 Subjective:    Patient ID: Jewelene Morton, female    DOB: 08/01/1960, 63 y.o.   MRN: [id number] Chief Complaint  Patient presents with  . Follow-up    1 year no studies    HPI  Review of Systems     Objective:   Physical Exam  BP 113/78   Pulse 77   Resp 18   Ht 5\' 8"  (1.727 m)   Wt 190 lb 9.6 oz (86.5 kg)   BMI 28.98 kg/m   Past Medical History:  Diagnosis Date  . Cancer (HCC)    skin cancer  . Chronic kidney disease    improved post discontinuing NSAIDS  . Hx of dysplastic nevus 10/13/2012   L proximal ant thigh, mild atypia  . Hx of dysplastic nevus 10/13/2012   L dorsum foot, mild atypia  . Hx of dysplastic nevus 10/22/2014   R ant lat ankle, mild atypia  . Hypothyroidism   . IBS (irritable bowel syndrome)   . Mitral valve prolapse     Social History   Socioeconomic History  . Marital status: Married    Spouse name: Not on file  . Number of children: Not on file  . Years of education: Not on file  . Highest education level: Not on file  Occupational History  . Not on file  Tobacco Use  . Smoking status: Never  . Smokeless tobacco: Never  Vaping Use  . Vaping status: Never Used  Substance and Sexual Activity  . Alcohol use: Yes    Comment: once a month  . Drug use: No  . Sexual activity: Yes    Birth control/protection: None, Surgical  Other Topics Concern  . Not on file  Social History Narrative  . Not on file   Social Drivers of Health   Financial Resource Strain: Low Risk  (03/26/2023)   Received from [facility]   Overall Financial Resource Strain (CARDIA)   . Difficulty of Paying Living Expenses: Not hard at all  Food Insecurity: No Food Insecurity (03/26/2023)   Received from [facility]   Hunger Vital Sign   . Worried About Programme researcher, broadcasting/film/video in the Last Year: Never true   . Ran Out of Food in the Last Year: Never true  Transportation Needs: No Transportation Needs (03/26/2023)   Received  from [facility]   [facility] - Transportation   . In the past 12 months, has lack of transportation kept you from medical appointments or from getting medications?: No   . Lack of Transportation (Non-Medical): No  Physical Activity: Inactive (03/26/2023)   Received from [facility]   Exercise Vital Sign   . Days of Exercise per Week: 0 days   . Minutes of Exercise per Session: 0 min  Stress: Not on file  Social Connections: Not on file  Intimate Partner Violence: Not on file    Past Surgical History:  Procedure Laterality Date  . ABDOMINAL HYSTERECTOMY    . APPENDECTOMY    . BIOPSY  11/13/2022   Procedure: BIOPSY;  Surgeon: Shane Darling, MD;  Location: [facility] ENDOSCOPY;  Service: Endoscopy;;  . BREAST SURGERY     left nipple biopsy  . COLONOSCOPY WITH PROPOFOL  N/A 11/13/2022   Procedure: COLONOSCOPY WITH PROPOFOL ;  Surgeon: Shane Darling, MD;  Location: [facility] ENDOSCOPY;  Service: Endoscopy;  Laterality: N/A;  . ESOPHAGOGASTRODUODENOSCOPY (EGD) WITH PROPOFOL  N/A 11/13/2022   Procedure: ESOPHAGOGASTRODUODENOSCOPY (EGD) WITH PROPOFOL ;  Surgeon: Shane Darling, MD;  Location: [facility] ENDOSCOPY;  Service: Endoscopy;  Laterality: N/A;  . MANDIBLE SURGERY    . PERIPHERAL VASCULAR THROMBECTOMY Left 11/02/2019   Procedure: PERIPHERAL VASCULAR THROMBECTOMY / THROMBOLYSIS;  Surgeon: Celso College, MD;  Location: [facility] INVASIVE CV LAB;  Service: Cardiovascular;  Laterality: Left;  . SHOULDER SURGERY    . TUBAL LIGATION    . UPPER GI ENDOSCOPY  10/26/07   reflux esophagitis and a single gastric polyp    Family History  Problem Relation Age of Onset  . Hypertension Mother   . Heart disease Mother   . Arthritis Mother   . Graves' disease Mother   . Thyroid  cancer Mother   . Fibroids Mother   . Diverticulosis Mother   . Basal cell carcinoma Mother   . Melanoma Mother 69       3 melanomas in her lifetime  . Ulcers Father   . Hyperthyroidism Father    . Arthritis Father   . Congestive Heart Failure Father   . Diverticulosis Father   . Lung cancer Father   . Fibroids Sister   . Hypertension Sister   . Fibromyalgia Sister   . Heart disease Sister   . Lupus Sister   . Melanoma Brother 26  . Hypertension Brother   . Hyperlipidemia Brother   . Skin cancer Brother   . Congenital heart disease Brother   . Stroke Brother   . Basal cell carcinoma Brother   . Hypertension Brother   . Breast cancer Maternal Aunt 55  . Breast cancer Maternal Aunt 59  . Cancer Maternal Uncle        unknown dx  . Stroke Maternal Grandmother   . Stroke Maternal Grandfather   . Dementia Paternal Grandmother   . Liver cancer Paternal Grandfather   . Melanoma Niece 77       brother's daughter    Allergies  Allergen Reactions  . Nsaids   . Other        Latest Ref Rng & Units 12/26/2020   10:11 AM 03/06/2020   10:26 AM 12/07/2018   10:08 AM  CBC  WBC 3.4 - 10.8 x10E3/uL 5.9  5.8  4.2   Hemoglobin 11.1 - 15.9 g/dL 81.1  91.4  78.2   Hematocrit 34.0 - 46.6 % 36.0  36.7  38.5   Platelets 150 - 450 x10E3/uL 406  432  345       CMP     Component Value Date/Time   NA 140 12/26/2020 1011   K 4.3 12/26/2020 1011   CL 105 12/26/2020 1011   CO2 22 12/26/2020 1011   GLUCOSE 94 12/26/2020 1011   BUN 14 12/26/2020 1011   CREATININE 1.03 (H) 12/26/2020 1011   CALCIUM 10.0 12/26/2020 1011   PROT 7.0 12/26/2020 1011   ALBUMIN 4.4 12/26/2020 1011   AST 14 12/26/2020 1011   ALT 13 12/26/2020 1011   ALKPHOS 106 12/26/2020 1011   BILITOT <0.2 12/26/2020 1011   EGFR 63 12/26/2020 1011   GFRNONAA 64 03/06/2020 1026     No results found.     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 cetirizine (ZYRTEC) 10 MG tablet Take 10 mg by mouth daily.    . Cholecalciferol (VITAMIN D) 2000 units CAPS Take by mouth.    .  clidinium-chlordiazePOXIDE  (LIBRAX) 5-2.5 MG capsule TAKE 1  CAPSULE TWICE A DAY AS NEEDED 180 capsule 1  . Cyanocobalamin 5000 MCG/ML LIQD Place under the tongue. 1/2 dropper po QD    . DULoxetine  (CYMBALTA ) 60 MG capsule Take 1 capsule (60 mg total) by mouth daily. 90 capsule 0  . ELIQUIS  5 MG TABS tablet TAKE ONE TABLET BY MOUTH TWICE A DAY 180 tablet 3  . fluticasone (FLONASE) 50 MCG/ACT nasal spray USE 2 SPRAYS IN THE NOSE DAILY AS DIRECTED (Patient taking differently: as needed.) 16 g 5  . folic acid  (FOLVITE ) 1 MG tablet TAKE 1 TABLET DAILY 90 tablet 3  . levothyroxine  (SYNTHROID ) 150 MCG tablet TAKE 1 TABLET DAILY BEFORE BREAKFAST (NEED LABS BEFORE NEXT REFILL IS DUE) 30 tablet 11  . RABEprazole  (ACIPHEX ) 20 MG tablet Take 1 tablet (20 mg total) by mouth 2 (two) times daily. 180 tablet 1  . topiramate  (TOPAMAX ) 50 MG tablet TAKE 1 TABLET DAILY 90 tablet 0  . XDEMVY 0.25 % SOLN Apply to eye.     No current facility-administered medications on file prior to visit.    There are no Patient Instructions on file for this visit. No follow-ups on file.   Krystn Dermody E Cordera Stineman, NP

## 2023-07-13 ENCOUNTER — Encounter (INDEPENDENT_AMBULATORY_CARE_PROVIDER_SITE_OTHER): Payer: Self-pay

## 2023-08-30 ENCOUNTER — Telehealth (INDEPENDENT_AMBULATORY_CARE_PROVIDER_SITE_OTHER): Payer: Self-pay

## 2023-08-30 NOTE — Telephone Encounter (Signed)
 Patient reach out to the office stating that new insurance will start until next week. Patient will run out of Eliquis  on Wednesday and she requesting for samples until next week. Patient will be giving 2 boxes and she will pick up in the morning.

## 2024-03-13 ENCOUNTER — Ambulatory Visit: Payer: BC Managed Care – PPO | Admitting: Dermatology

## 2024-03-13 ENCOUNTER — Encounter: Payer: Self-pay | Admitting: Dermatology

## 2024-03-13 DIAGNOSIS — W908XXA Exposure to other nonionizing radiation, initial encounter: Secondary | ICD-10-CM

## 2024-03-13 DIAGNOSIS — L814 Other melanin hyperpigmentation: Secondary | ICD-10-CM | POA: Diagnosis not present

## 2024-03-13 DIAGNOSIS — L578 Other skin changes due to chronic exposure to nonionizing radiation: Secondary | ICD-10-CM | POA: Diagnosis not present

## 2024-03-13 DIAGNOSIS — Z808 Family history of malignant neoplasm of other organs or systems: Secondary | ICD-10-CM | POA: Diagnosis not present

## 2024-03-13 DIAGNOSIS — L821 Other seborrheic keratosis: Secondary | ICD-10-CM | POA: Diagnosis not present

## 2024-03-13 DIAGNOSIS — Z86018 Personal history of other benign neoplasm: Secondary | ICD-10-CM

## 2024-03-13 DIAGNOSIS — D1801 Hemangioma of skin and subcutaneous tissue: Secondary | ICD-10-CM | POA: Diagnosis not present

## 2024-03-13 DIAGNOSIS — Z1283 Encounter for screening for malignant neoplasm of skin: Secondary | ICD-10-CM

## 2024-03-13 DIAGNOSIS — D229 Melanocytic nevi, unspecified: Secondary | ICD-10-CM

## 2024-03-13 NOTE — Patient Instructions (Signed)

## 2024-03-13 NOTE — Progress Notes (Signed)
" ° °  Follow-Up Visit   Subjective  Crystal Russo is a 64 y.o. female who presents for the following: Skin Cancer Screening and Full Body Skin Exam  The patient presents for Total-Body Skin Exam (TBSE) for skin cancer screening and mole check. The patient has spots, moles and lesions to be evaluated, some may be new or changing and the patient may have concern these could be cancer.  Hx DN. Fhx MM.  Patient with psoriasis at feet, well controlled. Patient has used Otezla , Zoryve and Vtama to treat in the past. She has not needed to treat in almost 3 years. A few spots that have come up at mid chest that itch.   The following portions of the chart were reviewed this encounter and updated as appropriate: medications, allergies, medical history  Review of Systems:  No other skin or systemic complaints except as noted in HPI or Assessment and Plan.  Objective  Well appearing patient in no apparent distress; mood and affect are within normal limits.  A full examination was performed including scalp, head, eyes, ears, nose, lips, neck, chest, axillae, abdomen, back, buttocks, bilateral upper extremities, bilateral lower extremities, hands, feet, fingers, toes, fingernails, and toenails. All findings within normal limits unless otherwise noted below.   Relevant physical exam findings are noted in the Assessment and Plan.    Assessment & Plan   SKIN CANCER SCREENING PERFORMED TODAY.  ACTINIC DAMAGE - Chronic condition, secondary to cumulative UV/sun exposure - diffuse scaly erythematous macules with underlying dyspigmentation - Recommend daily broad spectrum sunscreen SPF 30+ to sun-exposed areas, reapply every 2 hours as needed.  - Staying in the shade or wearing long sleeves, sun glasses (UVA+UVB protection) and wide brim hats (4-inch brim around the entire circumference of the hat) are also recommended for sun protection.  - Call for new or changing lesions.  LENTIGINES, SEBORRHEIC  KERATOSES, HEMANGIOMAS - Benign normal skin lesions - Benign-appearing - Call for any changes  MELANOCYTIC NEVI - Tan-brown and/or pink-flesh-colored symmetric macules and papules - Benign appearing on exam today - Observation - Call clinic for new or changing moles - Recommend daily use of broad spectrum spf 30+ sunscreen to sun-exposed areas.   HISTORY OF DYSPLASTIC NEVI No evidence of recurrence today Recommend regular full body skin exams Recommend daily broad spectrum sunscreen SPF 30+ to sun-exposed areas, reapply every 2 hours as needed.  Call if any new or changing lesions are noted between office visits    FAMILY HISTORY OF SKIN CANCER What type(s): Melanoma Who affected: Mother, niece, brother    MULTIPLE BENIGN NEVI   LENTIGINES   ACTINIC ELASTOSIS   SEBORRHEIC KERATOSES   CHERRY ANGIOMA   Return in about 1 year (around 03/13/2025) for TBSE, with Dr. Claudene, HxDN, FhxMM.  LILLETTE Lonell Drones, RMA, am acting as scribe for Boneta Claudene, MD .   Documentation: I have reviewed the above documentation for accuracy and completeness, and I agree with the above.  Boneta Claudene, MD   "

## 2024-03-28 ENCOUNTER — Other Ambulatory Visit (INDEPENDENT_AMBULATORY_CARE_PROVIDER_SITE_OTHER): Payer: Self-pay | Admitting: Vascular Surgery

## 2024-06-13 ENCOUNTER — Ambulatory Visit (INDEPENDENT_AMBULATORY_CARE_PROVIDER_SITE_OTHER): Admitting: Vascular Surgery

## 2025-03-19 ENCOUNTER — Ambulatory Visit: Admitting: Dermatology
